# Patient Record
Sex: Female | Born: 1940 | Race: White | Hispanic: No | Smoking: Former smoker
Health system: Southern US, Community
[De-identification: ages and names within clinical notes are randomized; demographics above are authoritative.]

## PROBLEM LIST (undated history)

## (undated) DIAGNOSIS — E785 Hyperlipidemia, unspecified: Secondary | ICD-10-CM

## (undated) DIAGNOSIS — I341 Nonrheumatic mitral (valve) prolapse: Secondary | ICD-10-CM

## (undated) DIAGNOSIS — F329 Major depressive disorder, single episode, unspecified: Secondary | ICD-10-CM

## (undated) DIAGNOSIS — I10 Essential (primary) hypertension: Secondary | ICD-10-CM

## (undated) DIAGNOSIS — M797 Fibromyalgia: Secondary | ICD-10-CM

## (undated) DIAGNOSIS — M419 Scoliosis, unspecified: Secondary | ICD-10-CM

## (undated) DIAGNOSIS — F419 Anxiety disorder, unspecified: Secondary | ICD-10-CM

## (undated) DIAGNOSIS — IMO0002 Reserved for concepts with insufficient information to code with codable children: Secondary | ICD-10-CM

## (undated) DIAGNOSIS — M810 Age-related osteoporosis without current pathological fracture: Secondary | ICD-10-CM

## (undated) DIAGNOSIS — D131 Benign neoplasm of stomach: Secondary | ICD-10-CM

## (undated) DIAGNOSIS — F32A Depression, unspecified: Secondary | ICD-10-CM

## (undated) HISTORY — DX: Depression, unspecified: F32.A

## (undated) HISTORY — DX: Fibromyalgia: M79.7

## (undated) HISTORY — DX: Essential (primary) hypertension: I10

## (undated) HISTORY — DX: Scoliosis, unspecified: M41.9

## (undated) HISTORY — DX: Anxiety disorder, unspecified: F41.9

## (undated) HISTORY — DX: Nonrheumatic mitral (valve) prolapse: I34.1

## (undated) HISTORY — DX: Benign neoplasm of stomach: D13.1

## (undated) HISTORY — PX: BREAST SURGERY: SHX581

## (undated) HISTORY — PX: BACK SURGERY: SHX140

## (undated) HISTORY — DX: Reserved for concepts with insufficient information to code with codable children: IMO0002

## (undated) HISTORY — DX: Hyperlipidemia, unspecified: E78.5

## (undated) HISTORY — PX: BREAST EXCISIONAL BIOPSY: SUR124

## (undated) HISTORY — DX: Age-related osteoporosis without current pathological fracture: M81.0

## (undated) HISTORY — PX: TUBAL LIGATION: SHX77

## (undated) HISTORY — PX: TONSILLECTOMY AND ADENOIDECTOMY: SUR1326

## (undated) HISTORY — DX: Major depressive disorder, single episode, unspecified: F32.9

---

## 1999-10-22 ENCOUNTER — Other Ambulatory Visit: Admission: RE | Admit: 1999-10-22 | Discharge: 1999-10-22 | Payer: Self-pay | Admitting: Obstetrics & Gynecology

## 1999-10-22 DIAGNOSIS — D131 Benign neoplasm of stomach: Secondary | ICD-10-CM

## 1999-10-22 HISTORY — DX: Benign neoplasm of stomach: D13.1

## 2000-10-08 ENCOUNTER — Encounter (INDEPENDENT_AMBULATORY_CARE_PROVIDER_SITE_OTHER): Payer: Self-pay | Admitting: Specialist

## 2000-10-08 ENCOUNTER — Other Ambulatory Visit: Admission: RE | Admit: 2000-10-08 | Discharge: 2000-10-08 | Payer: Self-pay | Admitting: Gastroenterology

## 2000-11-11 ENCOUNTER — Other Ambulatory Visit: Admission: RE | Admit: 2000-11-11 | Discharge: 2000-11-11 | Payer: Self-pay | Admitting: Obstetrics & Gynecology

## 2001-11-20 ENCOUNTER — Other Ambulatory Visit: Admission: RE | Admit: 2001-11-20 | Discharge: 2001-11-20 | Payer: Self-pay | Admitting: Obstetrics & Gynecology

## 2003-01-25 ENCOUNTER — Other Ambulatory Visit: Admission: RE | Admit: 2003-01-25 | Discharge: 2003-01-25 | Payer: Self-pay | Admitting: Obstetrics & Gynecology

## 2004-03-22 ENCOUNTER — Other Ambulatory Visit: Admission: RE | Admit: 2004-03-22 | Discharge: 2004-03-22 | Payer: Self-pay | Admitting: Obstetrics & Gynecology

## 2005-06-20 ENCOUNTER — Ambulatory Visit: Payer: Self-pay | Admitting: Pulmonary Disease

## 2005-07-18 ENCOUNTER — Other Ambulatory Visit: Admission: RE | Admit: 2005-07-18 | Discharge: 2005-07-18 | Payer: Self-pay | Admitting: Obstetrics & Gynecology

## 2005-11-07 ENCOUNTER — Ambulatory Visit: Payer: Self-pay | Admitting: Pulmonary Disease

## 2005-11-18 ENCOUNTER — Ambulatory Visit: Payer: Self-pay | Admitting: Pulmonary Disease

## 2006-06-13 ENCOUNTER — Ambulatory Visit: Payer: Self-pay | Admitting: Pulmonary Disease

## 2006-08-05 ENCOUNTER — Ambulatory Visit: Payer: Self-pay | Admitting: Pulmonary Disease

## 2006-08-11 ENCOUNTER — Ambulatory Visit: Payer: Self-pay | Admitting: Pulmonary Disease

## 2006-08-11 LAB — CONVERTED CEMR LAB
Albumin: 3.9 g/dL (ref 3.5–5.2)
BUN: 14 mg/dL (ref 6–23)
CO2: 29 meq/L (ref 19–32)
Chloride: 108 meq/L (ref 96–112)
Chol/HDL Ratio, serum: 2.2
Creatinine, Ser: 1 mg/dL (ref 0.4–1.2)
GFR calc non Af Amer: 60 mL/min
Glomerular Filtration Rate, Af Am: 72 mL/min/{1.73_m2}
HDL: 48 mg/dL (ref >39.0)
Total Bilirubin: 0.5 mg/dL (ref 0.3–1.2)
Triglyceride fasting, serum: 42 mg/dL (ref 0–149)

## 2007-02-03 ENCOUNTER — Ambulatory Visit: Payer: Self-pay | Admitting: Pulmonary Disease

## 2007-02-03 LAB — CONVERTED CEMR LAB
ALT: 15 units/L (ref 0–40)
AST: 21 units/L (ref 0–37)
Albumin: 3.9 g/dL (ref 3.5–5.2)
Basophils Absolute: 0 10*3/uL (ref 0.0–0.1)
Bilirubin, Direct: 0.1 mg/dL (ref 0.0–0.3)
Calcium: 9.5 mg/dL (ref 8.4–10.5)
Chloride: 105 meq/L (ref 96–112)
Cholesterol: 145 mg/dL (ref 0–200)
Eosinophils Absolute: 0.1 10*3/uL (ref 0.0–0.6)
GFR calc Af Amer: 93 mL/min
GFR calc non Af Amer: 77 mL/min
Glucose, Bld: 93 mg/dL (ref 70–99)
HDL: 59.7 mg/dL (ref >39.0)
Lymphocytes Relative: 38.6 % (ref 12.0–46.0)
MCHC: 34.9 g/dL (ref 30.0–36.0)
MCV: 89 fL (ref 78.0–100.0)
Monocytes Relative: 7 % (ref 3.0–11.0)
Neutro Abs: 2.6 10*3/uL (ref 1.4–7.7)
Platelets: 252 10*3/uL (ref 150–400)
RBC: 4.54 M/uL (ref 3.87–5.11)
TSH: 1.26 microintl units/mL (ref 0.35–5.50)
Total CHOL/HDL Ratio: 2.4
Triglycerides: 87 mg/dL (ref 0–149)

## 2007-08-02 ENCOUNTER — Emergency Department (HOSPITAL_COMMUNITY): Admission: EM | Admit: 2007-08-02 | Discharge: 2007-08-02 | Payer: Self-pay | Admitting: Emergency Medicine

## 2007-08-04 ENCOUNTER — Emergency Department (HOSPITAL_COMMUNITY): Admission: EM | Admit: 2007-08-04 | Discharge: 2007-08-04 | Payer: Self-pay | Admitting: Emergency Medicine

## 2007-08-04 ENCOUNTER — Ambulatory Visit (HOSPITAL_COMMUNITY): Admission: RE | Admit: 2007-08-04 | Discharge: 2007-08-04 | Payer: Self-pay | Admitting: Emergency Medicine

## 2007-08-11 ENCOUNTER — Ambulatory Visit: Payer: Self-pay | Admitting: Pulmonary Disease

## 2007-12-15 ENCOUNTER — Ambulatory Visit: Payer: Self-pay | Admitting: Pulmonary Disease

## 2007-12-15 ENCOUNTER — Telehealth: Payer: Self-pay | Admitting: Pulmonary Disease

## 2007-12-15 LAB — CONVERTED CEMR LAB
Calcium: 10 mg/dL (ref 8.4–10.5)
Chloride: 101 meq/L (ref 96–112)
Creatinine, Ser: 0.9 mg/dL (ref 0.4–1.2)
GFR calc non Af Amer: 67 mL/min
Glucose, Bld: 101 mg/dL — ABNORMAL HIGH (ref 70–99)
Sodium: 141 meq/L (ref 135–145)

## 2008-01-13 ENCOUNTER — Ambulatory Visit: Payer: Self-pay | Admitting: Gastroenterology

## 2008-01-13 ENCOUNTER — Telehealth: Payer: Self-pay | Admitting: Pulmonary Disease

## 2008-01-21 ENCOUNTER — Telehealth (INDEPENDENT_AMBULATORY_CARE_PROVIDER_SITE_OTHER): Payer: Self-pay | Admitting: *Deleted

## 2008-01-22 ENCOUNTER — Encounter: Payer: Self-pay | Admitting: Pulmonary Disease

## 2008-01-22 ENCOUNTER — Ambulatory Visit: Payer: Self-pay | Admitting: Gastroenterology

## 2008-04-18 DIAGNOSIS — M810 Age-related osteoporosis without current pathological fracture: Secondary | ICD-10-CM

## 2008-04-18 DIAGNOSIS — E7800 Pure hypercholesterolemia, unspecified: Secondary | ICD-10-CM

## 2008-04-18 DIAGNOSIS — M545 Low back pain, unspecified: Secondary | ICD-10-CM | POA: Insufficient documentation

## 2008-04-18 DIAGNOSIS — Z8679 Personal history of other diseases of the circulatory system: Secondary | ICD-10-CM | POA: Insufficient documentation

## 2008-04-18 DIAGNOSIS — IMO0001 Reserved for inherently not codable concepts without codable children: Secondary | ICD-10-CM

## 2008-04-18 DIAGNOSIS — K5900 Constipation, unspecified: Secondary | ICD-10-CM | POA: Insufficient documentation

## 2008-04-18 DIAGNOSIS — I10 Essential (primary) hypertension: Secondary | ICD-10-CM

## 2008-04-18 DIAGNOSIS — F411 Generalized anxiety disorder: Secondary | ICD-10-CM | POA: Insufficient documentation

## 2008-04-18 HISTORY — DX: Generalized anxiety disorder: F41.1

## 2008-04-18 HISTORY — DX: Constipation, unspecified: K59.00

## 2008-04-18 HISTORY — DX: Essential (primary) hypertension: I10

## 2008-04-18 HISTORY — DX: Pure hypercholesterolemia, unspecified: E78.00

## 2008-04-18 HISTORY — DX: Age-related osteoporosis without current pathological fracture: M81.0

## 2008-04-18 HISTORY — DX: Personal history of other diseases of the circulatory system: Z86.79

## 2008-04-18 HISTORY — DX: Reserved for inherently not codable concepts without codable children: IMO0001

## 2008-04-18 HISTORY — DX: Low back pain, unspecified: M54.50

## 2008-05-12 ENCOUNTER — Encounter: Payer: Self-pay | Admitting: Pulmonary Disease

## 2008-06-14 ENCOUNTER — Ambulatory Visit: Payer: Self-pay | Admitting: Pulmonary Disease

## 2008-06-14 DIAGNOSIS — R002 Palpitations: Secondary | ICD-10-CM

## 2008-06-14 HISTORY — DX: Palpitations: R00.2

## 2008-06-19 DIAGNOSIS — D131 Benign neoplasm of stomach: Secondary | ICD-10-CM | POA: Insufficient documentation

## 2008-06-19 HISTORY — DX: Benign neoplasm of stomach: D13.1

## 2008-06-19 LAB — CONVERTED CEMR LAB
Albumin: 4.2 g/dL (ref 3.5–5.2)
Alkaline Phosphatase: 61 units/L (ref 39–117)
BUN: 14 mg/dL (ref 6–23)
Bacteria, UA: NEGATIVE
Basophils Absolute: 0 10*3/uL (ref 0.0–0.1)
Cholesterol: 176 mg/dL (ref 0–200)
Crystals: NEGATIVE
Eosinophils Absolute: 0.1 10*3/uL (ref 0.0–0.7)
GFR calc Af Amer: 93 mL/min
GFR calc non Af Amer: 77 mL/min
HDL: 68 mg/dL (ref >39.0)
LDL Cholesterol: 100 mg/dL — ABNORMAL HIGH (ref 0–99)
Leukocytes, UA: NEGATIVE
Lymphocytes Relative: 34.4 % (ref 12.0–46.0)
MCHC: 34.7 g/dL (ref 30.0–36.0)
MCV: 92.3 fL (ref 78.0–100.0)
Neutrophils Relative %: 55.4 % (ref 43.0–77.0)
Nitrite: NEGATIVE
Platelets: 234 10*3/uL (ref 150–400)
Potassium: 4.1 meq/L (ref 3.5–5.1)
RBC / HPF: NONE SEEN
Specific Gravity, Urine: <=1.005 (ref 1.000–1.03)
TSH: 0.92 microintl units/mL (ref 0.35–5.50)
Total Bilirubin: 0.8 mg/dL (ref 0.3–1.2)
Urobilinogen, UA: 0.2 (ref 0.0–1.0)
VLDL: 8 mg/dL (ref 0–40)
WBC, UA: NONE SEEN cells/hpf
pH: 7 (ref 5.0–8.0)

## 2008-07-28 ENCOUNTER — Telehealth (INDEPENDENT_AMBULATORY_CARE_PROVIDER_SITE_OTHER): Payer: Self-pay | Admitting: *Deleted

## 2008-08-12 ENCOUNTER — Ambulatory Visit: Payer: Self-pay | Admitting: Licensed Clinical Social Worker

## 2008-08-12 ENCOUNTER — Telehealth (INDEPENDENT_AMBULATORY_CARE_PROVIDER_SITE_OTHER): Payer: Self-pay | Admitting: *Deleted

## 2008-08-19 ENCOUNTER — Ambulatory Visit: Payer: Self-pay | Admitting: Licensed Clinical Social Worker

## 2008-08-25 ENCOUNTER — Telehealth: Payer: Self-pay | Admitting: Pulmonary Disease

## 2008-10-10 ENCOUNTER — Telehealth (INDEPENDENT_AMBULATORY_CARE_PROVIDER_SITE_OTHER): Payer: Self-pay | Admitting: *Deleted

## 2009-02-10 ENCOUNTER — Telehealth: Payer: Self-pay | Admitting: Pulmonary Disease

## 2009-03-10 ENCOUNTER — Telehealth (INDEPENDENT_AMBULATORY_CARE_PROVIDER_SITE_OTHER): Payer: Self-pay | Admitting: *Deleted

## 2009-03-31 ENCOUNTER — Telehealth: Payer: Self-pay | Admitting: Pulmonary Disease

## 2009-07-25 ENCOUNTER — Telehealth (INDEPENDENT_AMBULATORY_CARE_PROVIDER_SITE_OTHER): Payer: Self-pay | Admitting: *Deleted

## 2009-10-30 ENCOUNTER — Ambulatory Visit: Payer: Self-pay | Admitting: Pulmonary Disease

## 2009-10-30 DIAGNOSIS — J984 Other disorders of lung: Secondary | ICD-10-CM | POA: Insufficient documentation

## 2009-11-15 ENCOUNTER — Ambulatory Visit: Payer: Self-pay | Admitting: Pulmonary Disease

## 2009-11-26 LAB — CONVERTED CEMR LAB
ALT: 21 units/L (ref 0–35)
BUN: 15 mg/dL (ref 6–23)
CO2: 30 meq/L (ref 19–32)
Chloride: 105 meq/L (ref 96–112)
Cholesterol: 150 mg/dL (ref 0–200)
Creatinine, Ser: 0.8 mg/dL (ref 0.4–1.2)
Eosinophils Relative: 1.3 % (ref 0.0–5.0)
HDL: 73.6 mg/dL (ref >39.00)
LDL Cholesterol: 68 mg/dL (ref 0–99)
Lymphocytes Relative: 37.2 % (ref 12.0–46.0)
Monocytes Relative: 8.7 % (ref 3.0–12.0)
Neutrophils Relative %: 52.2 % (ref 43.0–77.0)
Platelets: 204 10*3/uL (ref 150.0–400.0)
TSH: 0.72 microintl units/mL (ref 0.35–5.50)
Total Protein: 7.2 g/dL (ref 6.0–8.3)
Triglycerides: 41 mg/dL (ref 0.0–149.0)
WBC: 3.9 10*3/uL — ABNORMAL LOW (ref 4.5–10.5)

## 2010-04-11 ENCOUNTER — Telehealth (INDEPENDENT_AMBULATORY_CARE_PROVIDER_SITE_OTHER): Payer: Self-pay | Admitting: *Deleted

## 2010-06-06 ENCOUNTER — Telehealth (INDEPENDENT_AMBULATORY_CARE_PROVIDER_SITE_OTHER): Payer: Self-pay | Admitting: *Deleted

## 2010-06-28 ENCOUNTER — Encounter: Admission: RE | Admit: 2010-06-28 | Discharge: 2010-06-28 | Payer: Self-pay | Admitting: Obstetrics & Gynecology

## 2010-07-24 ENCOUNTER — Telehealth: Payer: Self-pay | Admitting: Pulmonary Disease

## 2010-07-30 ENCOUNTER — Encounter: Admission: RE | Admit: 2010-07-30 | Discharge: 2010-07-30 | Payer: Self-pay | Admitting: Surgery

## 2010-07-30 ENCOUNTER — Ambulatory Visit (HOSPITAL_BASED_OUTPATIENT_CLINIC_OR_DEPARTMENT_OTHER): Admission: RE | Admit: 2010-07-30 | Discharge: 2010-07-30 | Payer: Self-pay | Admitting: Surgery

## 2010-11-01 ENCOUNTER — Ambulatory Visit
Admission: RE | Admit: 2010-11-01 | Discharge: 2010-11-01 | Payer: Self-pay | Source: Home / Self Care | Attending: Pulmonary Disease | Admitting: Pulmonary Disease

## 2010-11-11 ENCOUNTER — Encounter: Payer: Self-pay | Admitting: Obstetrics & Gynecology

## 2010-11-14 ENCOUNTER — Ambulatory Visit
Admission: RE | Admit: 2010-11-14 | Discharge: 2010-11-14 | Payer: Self-pay | Source: Home / Self Care | Attending: Pulmonary Disease | Admitting: Pulmonary Disease

## 2010-11-14 ENCOUNTER — Other Ambulatory Visit: Payer: Self-pay | Admitting: Pulmonary Disease

## 2010-11-14 LAB — BASIC METABOLIC PANEL
Chloride: 105 mEq/L (ref 96–112)
Potassium: 4.4 mEq/L (ref 3.5–5.1)

## 2010-11-14 LAB — CBC WITH DIFFERENTIAL/PLATELET
Basophils Absolute: 0 10*3/uL (ref 0.0–0.1)
Eosinophils Absolute: 0.1 10*3/uL (ref 0.0–0.7)
Lymphocytes Relative: 38.8 % (ref 12.0–46.0)
MCHC: 35.4 g/dL (ref 30.0–36.0)
Neutrophils Relative %: 50.5 % (ref 43.0–77.0)
Platelets: 184 10*3/uL (ref 150.0–400.0)
RDW: 12.1 % (ref 11.5–14.6)

## 2010-11-14 LAB — URINALYSIS
Bilirubin Urine: NEGATIVE
Ketones, ur: NEGATIVE
Nitrite: NEGATIVE
Specific Gravity, Urine: 1.01 (ref 1.000–1.030)
Total Protein, Urine: NEGATIVE
pH: 6.5 (ref 5.0–8.0)

## 2010-11-14 LAB — LIPID PANEL
Cholesterol: 167 mg/dL (ref 0–200)
LDL Cholesterol: 83 mg/dL (ref 0–99)
Triglycerides: 40 mg/dL (ref 0.0–149.0)
VLDL: 8 mg/dL (ref 0.0–40.0)

## 2010-11-14 LAB — HEPATIC FUNCTION PANEL
ALT: 22 U/L (ref 0–35)
AST: 28 U/L (ref 0–37)
Bilirubin, Direct: 0.1 mg/dL (ref 0.0–0.3)
Total Bilirubin: 0.7 mg/dL (ref 0.3–1.2)

## 2010-11-20 NOTE — Progress Notes (Signed)
  Phone Note Other Incoming   Request: Send information Summary of Call: Request for records received from [facility]. Request forwarded to [facility].

## 2010-11-20 NOTE — Progress Notes (Signed)
  Phone Note Other Incoming   Request: Send information Summary of Call: Request for records received from [facility]. Request forwarded to [facility].     

## 2010-11-20 NOTE — Progress Notes (Signed)
Summary: Rx for zpak sent to pts pharmacy  Phone Note Call from Patient Call back at Home Phone [phone]   Caller: Patient Call For: NADEL Summary of Call: pt feels like she has a cold.  cough "may have fever" but hasn't checked. coughing since yesterday. has taken zinc tablets. pt has surgery scheduled for monday. [facility] in [place] on [address].  Initial call taken by: Tivis Ringer, CNA,  July 24, 2010 4:17 PM  Follow-up for Phone Call        called and spoke with pt.  Pt states she believes she has a "cold."  Sx started yesterday.  Pt states she is scheduled for outpatient surgery to remove calcifications on breast on Monday 07/30/2010 and was told by the nurse to call our office for abx.  Pt c/o head congestion with clear nasal drainage, non-productive cough, body aches, eyes red, headache and horaseness.  Pt denied sore throat, sweats or chills.  Please advise.  Thanks. Aundra Millet Reynolds LPN  July 24, 2010 4:45 PM  NKDA   Additional Follow-up for Phone Call Additional follow up Details #1::        zpak sent to pts pharmacy, mucinex otc 2 by mouth two times a day per Dr Kriste Basque, pt aware Armita Donzetta Matters RCP, LPN  July 24, 2010 4:58 PM     New/Updated Medications: ZITHROMAX Z-PAK 250 MG TABS (AZITHROMYCIN) Take as directed Prescriptions: ZITHROMAX Z-PAK 250 MG TABS (AZITHROMYCIN) Take as directed  #1 x 0   Entered by:   Renold Genta RCP, LPN   Authorized by:   Michele Mcalpine MD   Signed by:   Renold Genta RCP, LPN on 42/35/3614   Method used:   Electronically to        [facility] (retail)       [address]       [place], [place]  [address]       Ph: [phone]       Fax: [phone]   RxID:   [id number]

## 2010-11-20 NOTE — Assessment & Plan Note (Signed)
Summary: rov/meds/apc   CC:  17 month ROV & review of mult medical problems....  History of Present Illness: 70 y/o WF here for a follow up visit... she has multiple medical problems as noted below...    ~  Aug09:  she states that she has been doing OK- except for anxiety (her father passed away in October 26, 2023 and she is handling his estate problems)...   ~  October 30, 2009:  she has still been under a good bit of stress but is coping satis w/ the Alprazolam Rx... BP controlled on Metoprolol;  due for f/u FLP on the Simva20;  needs f/u CXR & refills today...    Current Problem List:  PHYSICAL EXAMINATION (ICD-V70.0) - GYN= DrNeal for PAP, Mammograms, BMD (last seen 3/09)...   ~  Immunization:  she had Pneumovax 2000, & Tetanus 2000...  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  ~  CXR 1/11 showed   HYPERTENSION, BORDERLINE (ICD-401.9) - on METOPROLOL ER 50mg /d... BP= 112/62 today and similar at home... denies HA, fatigue, visual changes, CP, palipit, dizziness, syncope, dyspnea, edema, etc...  MITRAL VALVE PROLAPSE, HX OF (ICD-V12.50) & PALPITATIONS (ICD-785.1) - on ASA 81mg /d... she denies recent problems w/ CP or palpit...  ~  ? faint right carotid bruit in past... CDopplers 4/04 were WNL- no plaque etc...  HYPERCHOLESTEROLEMIA (ICD-272.0) - on SIMVASTATIN 20mg /d...  ~  FLP 4/08 showed TChol 145, TG 87. HDL 60, LDL 68... continue same med...  ~  FLP 5/09 showed TChol 176, TG 41, HDL 68, LDL 100  ~  FLP 1/11 showed =   Hx of GASTRIC POLYP (ICD-211.1)  ~  last EGD 12/01 showed tiny gastric polyps, bx= benign gastric gland polyp.  CONSTIPATION (ICD-564.00) - she uses "colon cleanser" every 2 weeks...  ~  last colonoscopy 4/09 was normal x for a tortuous colon- no divertics, no polyps, etc... f/u 8yrs.  BACK PAIN, LUMBAR (ICD-724.2) - Hx DDD & eval by DrRoy(Neurosurg) and DrTooke (Ortho) w/ 2 shots  that helped... on-going Rx from Chiropractor monthly which "helps my back & my constipation"...  FIBROMYALGIA (ICD-729.1)  OSTEOPOROSIS (ICD-733.00) - on ACTONEL 35mg /wk + calcium, Vits, Vit D... BMDs and meds from DrNeal.  ANXIETY (ICD-300.00) - takes ALPRAZOLAM 0.5mg  as needed... tried Lexapro & stopped this on her own.    Allergies (verified): No Known Drug Allergies  Past History:  Past Medical History:  OTHER DISEASES OF LUNG NOT ELSEWHERE CLASSIFIED (ICD-518.89) HYPERTENSION, BORDERLINE (ICD-401.9) MITRAL VALVE PROLAPSE, HX OF (ICD-V12.50) PALPITATIONS (ICD-785.1) HYPERCHOLESTEROLEMIA (ICD-272.0) Hx of GASTRIC POLYP (ICD-211.1) CONSTIPATION (ICD-564.00) BACK PAIN, LUMBAR (ICD-724.2) FIBROMYALGIA (ICD-729.1) OSTEOPOROSIS (ICD-733.00) ANXIETY (ICD-300.00)  Past Surgical History: S/P T & A S/P BTL  Family History: Reviewed history from 06/14/2008 and no changes required. mother deceased age 65 from aspiration father deceased age 68 from head injury (fall)  hx of HBP 1 sibling alive age 28  hx of pancreatic cancer  Social History: Reviewed history from 06/14/2008 and no changes required. 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    Vital Signs:  Patient profile:   70 year old female Height:      62 inches Weight:      102.13 pounds BMI:     18.75 O2 Sat:      98 % on Room air Temp:     96.6 degrees F  oral Pulse rate:   53 / minute BP sitting:   112 / 62  (left arm) Cuff size:   regular  Vitals Entered By: Randell Loop CMA (October 30, 2009 2:06 PM)  O2 Sat at Rest %:  98 O2 Flow:  Room air CC: 17 month ROV & review of mult medical problems... Is Patient Diabetic? No Pain Assessment Patient in pain? no      Comments meds updated today   Physical Exam  Additional Exam:  WD, WN, 70 y/o WF in NAD... GENERAL:  Alert & oriented; pleasant & cooperative... HEENT:  [place]/AT, EOM-wnl, PERRLA, Fundi-benign, EACs-clear, TMs-wnl, NOSE-clear, THROAT-clear & wnl. NECK:  Supple w/ full ROM; no JVD; normal carotid impulses w/o bruits; no thyromegaly or nodules palpated; no lymphadenopathy. CHEST:  Clear to P & A; without wheezes/ rales/ or rhonchi heard... HEART:  Regular Rhythm; without murmurs/ rubs/ or gallops detected... ABDOMEN:  Soft & nontender; normal bowel sounds; no organomegaly or masses palpated... EXT: without deformities or arthritic changes; no varicose veins/ venous insuffic/ or edema. NEURO:  CN's intact; motor testing normal; sensory testing normal; gait normal & balance OK. DERM:  No lesions noted; no rash etc...     CXR  Procedure date:  10/30/2009  Findings:      CHEST - 2 VIEW   Comparison: Chest x-ray of 05/25/2008   Findings: Apical pleuroparenchymal scarring right greater than left appears stable.  The lungs are clear and slightly hyperaerated.  No adenopathy is seen.  The heart is within upper limits of normal. No bony abnormality is noted.   IMPRESSION: Stable apical scarring right greater than left.  Stable hyperaeration.  No active process.   Read By:  Juline Patch,  M.D.       MISC. 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 Orders: T-2 View CXR (71020TC)  Problem # 2:  HYPERTENSION, BORDERLINE (ICD-401.9) Controlled on meds... continue same. Her updated medication list for this problem includes:    Metoprolol Succinate 50 Mg Tb24 (Metoprolol succinate) .Marland Kitchen... Take 1 tablet by mouth once a day  Problem # 3:  MITRAL VALVE PROLAPSE, HX OF (ICD-V12.50) She is asymptomatic...  Problem # 4:  HYPERCHOLESTEROLEMIA (ICD-272.0) Controlled by Simva20... continue same, due for f/u FLP & she will ret soon. Her updated medication list for this problem includes:    Simvastatin 20 Mg Tabs (Simvastatin) .Marland Kitchen... Take 1 tablet by mouth once a day  Problem # 5:  CONSTIPATION (ICD-564.00) GI is stable...   Problem # 6:  BACK PAIN, LUMBAR (ICD-724.2) She still sees chiropractor monthly for adjustments... Her updated medication list for this problem includes:    Aspirin Adult Low Strength 81 Mg Tbec (Aspirin) .Marland Kitchen... Take 1 tablet by mouth once a day  Problem # 7:  OSTEOPOROSIS (ICD-733.00) Followed by drNeal and repeat BMD due soon by her hx... Her updated medication list  for this problem includes:    Actonel 35 Mg Tabs (Risedronate sodium) .Marland Kitchen... Take one tablet by mouth every week  Problem # 8:  ANXIETY (ICD-300.00) Refill the Alprazolam... The following medications were removed from the medication list:    Lexapro 10 Mg Tabs (Escitalopram oxalate) .Marland Kitchen... Take 1po once daily Her updated medication list for this problem includes:    Alprazolam 0.5 Mg Tabs (Alprazolam) .Marland Kitchen... 1/2 to 1 tab by mouth three times a day as needed for nerves...  Complete Medication List: 1)  Aspirin Adult Low Strength 81 Mg Tbec (Aspirin) .... Take 1 tablet by mouth once a day 2)  Metoprolol Succinate 50 Mg Tb24 (Metoprolol succinate) .... Take 1 tablet by mouth once a day 3)  Simvastatin 20 Mg Tabs (Simvastatin) .... Take 1 tablet by mouth once a day 4)  Fish Oil 1000 Mg Caps (Omega-3 fatty acids)  .... Take 1 tablet by mouth once a day 5)  Actonel 35 Mg Tabs (Risedronate sodium) .... Take one tablet by mouth every week 6)  Calcium 600/vitamin D 600-400 Mg-unit Chew (Calcium carbonate-vitamin d) .... Take 1 tablet by mouth once a day 7)  Alprazolam 0.5 Mg Tabs (Alprazolam) .... 1/2 to 1 tab by mouth three times a day as needed for nerves... 8)  B Complex Tabs (B complex vitamins) .... Take 1 tablet by mouth once a day 9)  Mega Coq10 30 Mg Caps (Coenzyme q10) .... Take 1 tablet by mouth once a day  Other Orders: Prescription Created Electronically [phone])  Patient Instructions: 1)  Today we updated your med list- see below.... 2)  We refilled your meds per request... 3)  Today we did your follow up CXR.Marland KitchenMarland Kitchen 4)  Please return for FASTING blood work at your convenience one morning this week.... then please call the "phone tree" in a few days for your lab results.Marland KitchenMarland Kitchen  5)  Don't forget to take the Ensure/ Sustacal/ Boost daily to help your weight... 6)  Call for any problems.Marland KitchenMarland Kitchen 7)  Please schedule a follow-up appointment in 1 year, sooner as needed... Prescriptions: ALPRAZOLAM 0.5 MG TABS (ALPRAZOLAM) 1/2 to 1 tab by mouth three times a day as needed for nerves...  #100 x 5   Entered and Authorized by:   Michele Mcalpine MD   Signed by:   Michele Mcalpine MD on 10/30/2009   Method used:   Print then Give to Patient   RxID:   [id number] SIMVASTATIN 20 MG  TABS (SIMVASTATIN) Take 1 tablet by mouth once a day  #30 x prn   Entered and Authorized by:   Michele Mcalpine MD   Signed by:   Michele Mcalpine MD on 10/30/2009   Method used:   Print then Give to Patient   RxID:   [id number] METOPROLOL SUCCINATE 50 MG  TB24 (METOPROLOL SUCCINATE) Take 1 tablet by mouth once a day  #30 x prn   Entered and Authorized by:   Michele Mcalpine MD   Signed by:   Michele Mcalpine MD on 10/30/2009   Method used:   Print then Give to Patient   RxID:   [id number]

## 2010-11-22 NOTE — Assessment & Plan Note (Signed)
Summary: physicial//sh   CC:  Yearly ROV & CPX....  History of Present Illness: 70 y/o WF here for a follow up visit & CPX...   ~  Aug09:  she states that she has been doing OK- except for anxiety (her father passed away in 26-Oct-2023 and she is handling his estate problems)...   ~  October 30, 2009:  she has still been under a good bit of stress but is coping satis w/ the Alprazolam Rx... BP controlled on Metoprolol;  due for f/u FLP on the Simva20;  needs f/u CXR & refills today...   ~  November 01, 2010:  she has had some persistant LBP w/ radiation to her right hip area> she had had prev evals from Neurosurg- DrRoy, and Ortho- DrTooke;  she still sees a Land, DrWilliams every month w/ some relief... I advised her to f/u w/ DrTooke if the chiro manipulations are not providing benefit as she may need f/u MRI etc... medically she is stable> notes continued stress & she takes the Alpraz Bid which helps;  BP controlled on MetopER50;  occas palpit w/o progression & she knows to elim caffeine;  Chol controlled w/ diet + Simva20;  she reports that DrNeal has stopped her Actonel for a drug holiday...    Current Problem List:  PHYSICAL EXAMINATION (ICD-V70.0)  ~  GI:  followed by DrStark w/ colon 4/09= neg... f/u planned 38yrs.  ~  GYN:  DrNeal for PAP, Mammograms, BMD & he stopped her Bisphos Rx in 2011 for a drug holiday...   ~  Immunization:  she had Pneumovax 2000 & Tetanus 2000... get yearly Flu vaccine in autumn.  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  ~  CXR 1/11 showed chr changes, NAD.Marland Kitchen.  ~  CXR 1/12 showed similar chr changes, NAD...  HYPERTENSION, BORDERLINE (ICD-401.9) - on METOPROLOL ER 50mg /d... BP= 110/60 today and similar at home... denies HA, fatigue, visual changes, CP, palipit, dizziness, syncope, dyspnea, edema, etc...  MITRAL VALVE PROLAPSE, HX OF (ICD-V12.50) & PALPITATIONS  (ICD-785.1) - on ASA 81mg /d... she denies recent problems w/ CP or palpit...  ~  ? faint right carotid bruit in past... CDopplers 4/04 were WNL- no plaque etc...  HYPERCHOLESTEROLEMIA (ICD-272.0) - on SIMVASTATIN 20mg /d...  ~  FLP 4/08 showed TChol 145, TG 87. HDL 60, LDL 68... continue same med...  ~  FLP 5/09 showed TChol 176, TG 41, HDL 68, LDL 100  ~  FLP 1/11 showed TChol 150, TG 41, HDL 74, LDL 68  Hx of GASTRIC POLYP (ICD-211.1)  ~  last EGD 12/01 showed tiny gastric polyps, bx= benign gastric gland polyp.  CONSTIPATION (ICD-564.00) - she uses "colon cleanser" every 2 weeks...  ~  last colonoscopy 4/09 was normal x for a tortuous colon- no divertics, no polyps, etc... f/u 30yrs.  BACK PAIN, LUMBAR (ICD-724.2) - Hx DDD & eval by DrRoy(Neurosurg) and DrTooke (Ortho) w/ 2 shots that helped... on-going Rx from Chiropractor monthly which "helps my back & my constipation"...  ~  1/12:  she notes incr back & right hip discomfort, still getting chiropractic rx Qmonth- advised f/u w/ Ortho for XRays/ Scans if not resolved.  FIBROMYALGIA (ICD-729.1)  OSTEOPOROSIS (ICD-733.00) - prev on Actonel per DrNeal (stopped in 2011 for drug holiday) + calcium, Vits, Vit D... BMDs per DrNeal.  ANXIETY (ICD-300.00) - takes ALPRAZOLAM 0.5mg  as needed (taking 2 daily)... tried Lexapro & stopped this on her own.  Preventive Screening-Counseling & Management  Alcohol-Tobacco     Smoking Status: quit     Year Quit: 1980's  Allergies (verified): No Known Drug Allergies  Comments:  Nurse/Medical Assistant: The patient's medications and allergies were reviewed with the patient and were updated in the Medication and Allergy Lists.  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 mother deceased age 64 from aspiration father deceased age 60 from head injury (fall)  hx of HBP 1 sibling alive age 22  hx of pancreatic cancer  Social History: Reviewed history from 10/30/2009 and no changes required. 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  The patient denies fever, chills, sweats, anorexia, fatigue, weakness, malaise, weight loss, sleep disorder, blurring, diplopia, eye irritation, eye discharge, vision loss, eye pain, photophobia, earache, ear discharge, tinnitus, decreased hearing, nasal congestion, nosebleeds, sore throat, hoarseness, chest pain, palpitations, syncope, dyspnea on exertion, orthopnea, PND, peripheral edema, cough, dyspnea at rest, excessive sputum, hemoptysis, wheezing, pleurisy, nausea, vomiting, diarrhea, constipation, change in bowel habits, abdominal pain, melena, hematochezia, jaundice, gas/bloating, indigestion/heartburn, dysphagia, odynophagia, dysuria, hematuria, urinary frequency, urinary hesitancy, nocturia, incontinence, joint swelling, muscle cramps, muscle weakness, stiffness, sciatica, restless legs, leg pain at night, leg pain with exertion, rash, itching, dryness, suspicious lesions, paralysis, paresthesias, seizures, tremors, vertigo, transient blindness, frequent falls, frequent headaches, difficulty walking, depression, memory loss, confusion, cold intolerance, heat intolerance, polydipsia, polyphagia, polyuria, unusual weight change, abnormal bruising, bleeding, enlarged lymph nodes, urticaria, allergic rash, hay fever, and  recurrent infections.    Vital Signs:  Patient profile:   71 year old female Height:      62 inches Weight:      107 pounds BMI:     19.64 O2 Sat:      97 % on Room air Temp:     97.0 degrees F oral Pulse rate:   68 / minute BP sitting:   110 / 60  (left arm) Cuff size:   regular  Vitals Entered By: Randell Loop CMA (November 01, 2010 11:05 AM)  O2 Sat at Rest %:  97 O2 Flow:  Room air CC: Yearly ROV & CPX... Is Patient Diabetic? No Pain Assessment Patient in pain? yes      Onset of pain  lower back pain radiates into right hip Comments meds updated today with pt   Physical Exam  Additional Exam:  WD, WN, 70 y/o WF in NAD... GENERAL:  Alert & oriented; pleasant & cooperative... HEENT:  [place]/AT, EOM-wnl, PERRLA, Fundi-benign, EACs-clear, TMs-wnl, NOSE-clear, THROAT-clear & wnl. NECK:  Supple w/ full ROM; no JVD; normal carotid impulses w/o bruits; no thyromegaly or nodules palpated; no lymphadenopathy. CHEST:  Clear to P & A; without wheezes/ rales/ or rhonchi heard... HEART:  Regular Rhythm; without murmurs/ rubs/ or gallops detected... ABDOMEN:  Soft & nontender; normal bowel sounds; no organomegaly or masses palpated... EXT: without deformities or arthritic changes; no varicose veins/ venous insuffic/ or edema. NEURO:  CN's intact; motor testing normal; sensory testing normal; gait normal & balance OK.  DERM:  No lesions noted; no rash etc...    CXR  Procedure date:  11/01/2010  Findings:      CHEST - 2 VIEW Comparison: Chest 10/30/2009.   Findings: The lungs are clear.  Heart size is normal.  No pneumothorax or pleural effusion.  There is some biapical pleural scarring.   IMPRESSION: No acute finding.  Stable compared to prior exam.   Read By:  Charyl Dancer,  M.D.   MISC. Report  Procedure date:  11/02/2010  Findings:      PT TO RETURN FOR FASTING BLOOD WORK & WE WILL COMMUNICATE THE RESULTS TO HER WHEN AVAILABLE>>>   Impression &  Recommendations:  Problem # 1:  PHYSICAL EXAMINATION (ICD-V70.0) She had records sent to [facility] in July & Sept for local care, but requested physical & med refills from [place] still... Orders: T-2 View CXR (71020TC) She will return for FASTINg blood work...  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 Orders: T-2 View CXR (71020TC)  Problem # 3:  HYPERTENSION, BORDERLINE (ICD-401.9) Controlled on med>  continue same. Her updated medication list for this problem includes:    Metoprolol Succinate 50 Mg Tb24 (Metoprolol succinate) .Marland Kitchen... Take 1 tablet by mouth once a day  Problem # 4:  PALPITATIONS (ICD-785.1) Min intermittent self-limited palpit & she is advised to elim caffeine etc... continue med. Her updated medication list for this problem includes:    Metoprolol Succinate 50 Mg Tb24 (Metoprolol succinate) .Marland Kitchen... Take 1 tablet by mouth once a day  Problem # 5:  HYPERCHOLESTEROLEMIA (ICD-272.0) Stable on Simva20>  continue same for now & she will ret for FLP soon. Her updated medication list for this problem includes:    Simvastatin 20 Mg Tabs (Simvastatin) .Marland Kitchen... Take 1 tablet by mouth once a day  Problem # 6:  CONSTIPATION (ICD-564.00) GI stable & up to date...  Problem # 7:  BACK PAIN, LUMBAR (ICD-724.2) She will f/u w/ Ortho & call us to refer if she can't get her own appt... Her updated medication list for this problem includes:    Aspirin Adult Low Strength 81 Mg Tbec (Aspirin) .Marland Kitchen... Take 1 tablet by mouth once a day  Problem # 8:  ANXIETY (ICD-300.00) Requests refill Alprazolam... Her updated medication list for this problem includes:    Alprazolam 0.5 Mg Tabs (Alprazolam) .Marland Kitchen... 1/2 to 1 tab by mouth three times a day as needed for nerves...  Complete Medication List: 1)  Aspirin Adult Low Strength 81 Mg Tbec (Aspirin) .... Take 1 tablet by  mouth once a day 2)  Metoprolol Succinate 50 Mg Tb24 (Metoprolol succinate) .... Take 1 tablet by mouth once a day 3)  Simvastatin 20 Mg Tabs (Simvastatin) .... Take 1 tablet by mouth once a day 4)  Fish Oil 1000 Mg Caps (Omega-3 fatty acids) .... Take 1 tablet by mouth once a day 5)  Mega Coq10 30 Mg Caps (Coenzyme q10) .... Take 1 tablet by mouth once a day 6)  Glucosamine-chondroitin 1500-1200 Mg/87ml Liqd (Glucosamine-chondroitin) .... Take 1 tablet by mouth once a day 7)  Calcium 600/vitamin D 600-400 Mg-unit Chew (Calcium carbonate-vitamin d) .... Take 1 tablet by mouth once a day 8)  B Complex Tabs (B complex vitamins) .... Take 1 tablet by mouth once a day 9)  Alprazolam 0.5 Mg Tabs (Alprazolam) .... 1/2 to 1 tab by mouth three times  a day as needed for nerves...  Patient Instructions: 1)  Today we updated your med list- see below.... 2)  We refilled your meds for 2012... 3)  Today we did your follow up CXR... please return to our office one morning next week for your FASTING blood work...  then please call the "phone tree" in a few days for your lab results.Marland KitchenMarland Kitchen  4)  Stay away from caffeine, & try to de-stress your life... 5)  Call for any problems.Marland KitchenMarland Kitchen 6)  Please schedule a follow-up appointment in 1 year, sooner as needed. Prescriptions: ALPRAZOLAM 0.5 MG TABS (ALPRAZOLAM) 1/2 to 1 tab by mouth three times a day as needed for nerves...  #100 x 6   Entered and Authorized by:   Michele Mcalpine MD   Signed by:   Michele Mcalpine MD on 11/01/2010   Method used:   Print then Give to Patient   RxID:   [id number] SIMVASTATIN 20 MG  TABS (SIMVASTATIN) Take 1 tablet by mouth once a day  #90 x 4   Entered and Authorized by:   Michele Mcalpine MD   Signed by:   Michele Mcalpine MD on 11/01/2010   Method used:   Print then Give to Patient   RxID:   [id number] METOPROLOL SUCCINATE 50 MG  TB24 (METOPROLOL SUCCINATE) Take 1 tablet by mouth once a day  #90 x 4   Entered and Authorized by:    Michele Mcalpine MD   Signed by:   Michele Mcalpine MD on 11/01/2010   Method used:   Print then Give to Patient   RxID:   [id number]    Immunization History:  Influenza Immunization History:    Influenza:  historical (08/02/2009)

## 2011-01-03 LAB — BASIC METABOLIC PANEL
CO2: 32 mEq/L (ref 19–32)
Calcium: 9.4 mg/dL (ref 8.4–10.5)
Chloride: 105 mEq/L (ref 96–112)
GFR calc Af Amer: >60 mL/min (ref >60)
Sodium: 140 mEq/L (ref 135–145)

## 2011-03-05 NOTE — Assessment & Plan Note (Signed)
[facility]                             PULMONARY OFFICE NOTE   NAME:BISHOPUnity, Luepke                    MRN:          [id number]  DATE:08/11/2007                            DOB:          1941-03-16    HISTORY OF PRESENT ILLNESS:  Patient is a 69 year old white female  patient of Dr. Jodelle Green who has a known history of fibromyalgia,  hyperlipidemia, and low back pain, who presents today for an acute  office visit.  Patient complains that over the last week she has had  lower back pain, increased on the right.  She was diagnosed with  sciatica in the emergency room, initially seen one week ago.  Patient  reports they gave her some Vicodin.  Patient's symptoms did not improve.  She was subsequently seen again two days later and underwent a recent  MRI in which she was told she had a bulging disk.  Patient reports the  pain continues.  She has run out of her Vicodin.   Patient denies any known injury, chest pain, palpitations, abdominal  pain, nausea, vomiting, or radicular symptoms.  Denies any urinary or  bowel incontinence.   PAST MEDICAL HISTORY:  1. Fibromyalgia.  2. Low back pain.  3. Hyperlipidemia, on simvastatin.  4. Osteopenia.  5. Mitral valve prolapse, palpitations.   CURRENT MEDICATIONS:  1. Simvastatin 20 mg nightly.  2. Metoprolol 50 mg daily.  3. Actonel weekly.  4. Aspirin 81 mg daily.  5. Calcium with vitamin D b.i.d.   DRUG ALLERGIES:  No known drug allergies.   PHYSICAL EXAMINATION:  Patient is a pleasant female in no acute  distress.  She is afebrile with stable vital signs.  HEENT:  Unremarkable.  NECK:  Supple without cervical adenopathy.  No JVD.  LUNGS:  Lung sounds are clear.  CARDIAC:  Regular rate and rhythm.  ABDOMEN:  Soft and nontender.  No palpable hepatosplenomegaly.  No  guarding or rebound.  No abdominal bruits or masses appreciated.  EXTREMITIES:  Warm without any calf cyanosis, clubbing, or edema.   Moves  all extremities well.  Equal strength at the upper extremities.  Slightly decreased strength in the right lower extremity.  Negative  straight leg raises.  Patient has normal DCRs.  Patient has tenderness  along the right lumbosacral region and positive tenderness over the SI  joint.  Hip range of motion is decreased on the right with reproducible  symptoms.   DATA:  MRI on October 14th revealed a right foraminal protrusion at L3-  4, superimposed on disk bulge with disk contracting the existing L3 root  with multilevel facet degenerative changes at L3-4.   IMPRESSION/PLAN:  Lower back pain with degenerative changes and disk  protrusion on L3-4.  Patient will be referred over to orthopedics for  further evaluation and treatment options.  Patient will begin Motrin 800  mg t.i.d. with food x7-10 days.  Add in Skelaxin as needed for muscle  spasm.  May use Vicodin #30, was given without any refills, using 1-2  every 4-6 hours.  Patient is to alternate ice and heat  to the area.  Patient will follow up with orthopedics, as recommended.  Patient is to  contact sooner if symptoms do not improve or worsen.      Rubye Oaks, NP  Electronically Signed      Lonzo Cloud. Kriste Basque, MD  Electronically Signed   TP/MedQ  DD: 08/11/2007  DT: 08/12/2007  Job #: [id number]

## 2011-05-13 ENCOUNTER — Other Ambulatory Visit: Payer: Self-pay | Admitting: Pulmonary Disease

## 2011-05-17 ENCOUNTER — Other Ambulatory Visit: Payer: Self-pay | Admitting: Pulmonary Disease

## 2011-12-13 ENCOUNTER — Other Ambulatory Visit: Payer: Self-pay | Admitting: Pulmonary Disease

## 2012-01-17 ENCOUNTER — Other Ambulatory Visit: Payer: Self-pay | Admitting: Neurosurgery

## 2012-01-17 DIAGNOSIS — M5126 Other intervertebral disc displacement, lumbar region: Secondary | ICD-10-CM

## 2012-01-29 ENCOUNTER — Other Ambulatory Visit: Payer: Self-pay | Admitting: Neurosurgery

## 2012-01-29 ENCOUNTER — Ambulatory Visit
Admission: RE | Admit: 2012-01-29 | Discharge: 2012-01-29 | Disposition: A | Discharge status: Home / Self Care | Payer: Medicare Other | Source: Ambulatory Visit | Attending: Neurosurgery | Admitting: Neurosurgery

## 2012-01-29 DIAGNOSIS — M419 Scoliosis, unspecified: Secondary | ICD-10-CM

## 2012-08-04 ENCOUNTER — Other Ambulatory Visit: Payer: Self-pay | Admitting: Obstetrics & Gynecology

## 2012-08-04 DIAGNOSIS — Z1231 Encounter for screening mammogram for malignant neoplasm of breast: Secondary | ICD-10-CM

## 2012-08-18 ENCOUNTER — Encounter: Payer: Self-pay | Admitting: Gastroenterology

## 2012-09-02 ENCOUNTER — Ambulatory Visit
Admission: RE | Admit: 2012-09-02 | Discharge: 2012-09-02 | Disposition: A | Discharge status: Home / Self Care | Payer: Medicare Other | Source: Ambulatory Visit | Attending: Obstetrics & Gynecology | Admitting: Obstetrics & Gynecology

## 2012-09-02 DIAGNOSIS — Z1231 Encounter for screening mammogram for malignant neoplasm of breast: Secondary | ICD-10-CM

## 2012-09-10 ENCOUNTER — Ambulatory Visit (INDEPENDENT_AMBULATORY_CARE_PROVIDER_SITE_OTHER): Payer: Medicare Other | Admitting: Gastroenterology

## 2012-09-10 ENCOUNTER — Encounter: Payer: Self-pay | Admitting: Gastroenterology

## 2012-09-10 VITALS — BP 120/60 | HR 66 | Ht 60.5 in | Wt 123.8 lb

## 2012-09-10 DIAGNOSIS — K5900 Constipation, unspecified: Secondary | ICD-10-CM

## 2012-09-10 DIAGNOSIS — R195 Other fecal abnormalities: Secondary | ICD-10-CM

## 2012-09-10 MED ORDER — PEG-KCL-NACL-NASULF-NA ASC-C 100 G PO SOLR: 1.0000 | Freq: Once | ORAL | Status: DC

## 2012-09-10 NOTE — Progress Notes (Signed)
History of Present Illness: This is a 71 year old female with Hemoccult positive stool. She has chronic constipation and uses a stool softener on a daily basis and colon cleanse laxative about every week. She notes for bloating and abdominal discomfort when she becomes constipated. She occasionally notes small amounts of bright red blood per rectum with hard stools. Recent screening Hemoccults were positive. She underwent colonoscopy in April 2009 that showed a tortuous colon but was otherwise remarkable. Denies weight loss, abdominal pain, diarrhea, change in stool caliber, melena, hematochezia, nausea, vomiting, dysphagia, reflux symptoms, chest pain.   Review of Systems: Pertinent positive and negative review of systems were noted in the above HPI section. All other review of systems were otherwise negative. Current Medications, Allergies, Past Medical History, Past Surgical History, Family History and Social History were reviewed in [employer] record.  Physical Exam: General: Well developed , well nourished, no acute distress Head: Normocephalic and atraumatic Eyes:  sclerae anicteric, EOMI Ears: Normal auditory acuity Mouth: No deformity or lesions Neck: Supple, no masses or thyromegaly Lungs: Clear throughout to auscultation Heart: Regular rate and rhythm; no murmurs, rubs or bruits Abdomen: Soft, non tender and non distended. No masses, hepatosplenomegaly or hernias noted. Normal Bowel sounds Rectal: deferred to colonoscopy Musculoskeletal: Symmetrical with no gross deformities  Skin: No lesions on visible extremities Pulses:  Normal pulses noted Extremities: No clubbing, cyanosis, edema or deformities noted Neurological: Alert oriented x 4, grossly nonfocal Cervical Nodes:  No significant cervical adenopathy Inguinal Nodes: No significant inguinal adenopathy Psychological:  Alert and cooperative. Normal mood and affect  Assessment and Recommendations:  1.  Hemoccult positive stool, occasional small volume hematochezia and chronic constipation. Last colonoscopy was 4-1/2 years ago. I suspect a benign anorectal source of occult bleeding such as hemorrhoids however colorectal presents should excluded with colonoscopy. Trial of MiraLax 1-3 times daily titrated for adequate bowel movements and attempt to discontinue colon cleanse laxative. The risks, benefits, and alternatives to colonoscopy with possible biopsy and possible polypectomy were discussed with the patient and they consent to proceed.

## 2012-09-10 NOTE — Patient Instructions (Addendum)
You have been scheduled for a colonoscopy with propofol. Please follow written instructions given to you at your visit today.  Please pick up your prep kit at the pharmacy within the next 1-3 days. If you use inhalers (even only as needed) or a CPAP machine, please bring them with you on the day of your procedure.  You can start Miralax over the counter mixing 17 grams in 8 oz of water 1-3 x daily to titrate depending on your bowel movements.  Please try to stop taking Colon Cleanse.  cc: Varney Baas, MD

## 2012-10-22 ENCOUNTER — Ambulatory Visit (AMBULATORY_SURGERY_CENTER): Payer: Medicare Other | Admitting: Gastroenterology

## 2012-10-22 ENCOUNTER — Encounter: Payer: Self-pay | Admitting: Gastroenterology

## 2012-10-22 VITALS — BP 151/73 | HR 63 | Temp 97.6°F | Resp 21 | Ht 60.5 in | Wt 123.0 lb

## 2012-10-22 DIAGNOSIS — R195 Other fecal abnormalities: Secondary | ICD-10-CM

## 2012-10-22 MED ORDER — SODIUM CHLORIDE 0.9 % IV SOLN: 500.0000 mL | INTRAVENOUS | Status: DC

## 2012-10-22 NOTE — Progress Notes (Signed)
Patient did not experience any of the following events: a burn prior to discharge; a fall within the facility; wrong site/side/patient/procedure/implant event; or a hospital transfer or hospital admission upon discharge from the facility. (G8907) Patient did not have preoperative order for IV antibiotic SSI prophylaxis. (G8918)  

## 2012-10-22 NOTE — Patient Instructions (Addendum)
YOU HAD AN ENDOSCOPIC PROCEDURE TODAY AT THE [facility]: Refer to the procedure report that was given to you for any specific questions about what was found during the examination.  If the procedure report does not answer your questions, please call your gastroenterologist to clarify.  If you requested that your care partner not be given the details of your procedure findings, then the procedure report has been included in a sealed envelope for you to review at your convenience later.  YOU SHOULD EXPECT: Some feelings of bloating in the abdomen. Passage of more gas than usual.  Walking can help get rid of the air that was put into your GI tract during the procedure and reduce the bloating. If you had a lower endoscopy (such as a colonoscopy or flexible sigmoidoscopy) you may notice spotting of blood in your stool or on the toilet paper. If you underwent a bowel prep for your procedure, then you may not have a normal bowel movement for a few days.  DIET: Your first meal following the procedure should be a light meal and then it is ok to progress to your normal diet.  A half-sandwich or bowl of soup is an example of a good first meal.  Heavy or fried foods are harder to digest and may make you feel nauseous or bloated.  Likewise meals heavy in dairy and vegetables can cause extra gas to form and this can also increase the bloating.  Drink plenty of fluids but you should avoid alcoholic beverages for 24 hours.  ACTIVITY: Your care partner should take you home directly after the procedure.  You should plan to take it easy, moving slowly for the rest of the day.  You can resume normal activity the day after the procedure however you should NOT DRIVE or use heavy machinery for 24 hours (because of the sedation medicines used during the test).    SYMPTOMS TO REPORT IMMEDIATELY: A gastroenterologist can be reached at any hour.  During normal business hours, 8:30 AM to 5:00 PM Monday through Friday,  call [phone].  After hours and on weekends, please call the GI answering service at [phone] who will take a message and have the physician on call contact you.   Following lower endoscopy (colonoscopy or flexible sigmoidoscopy):  Excessive amounts of blood in the stool  Significant tenderness or worsening of abdominal pains  Swelling of the abdomen that is new, acute  Fever of 100F or higher    FOLLOW UP: If any biopsies were taken you will be contacted by phone or by letter within the next 1-3 weeks.  Call your gastroenterologist if you have not heard about the biopsies in 3 weeks.  Our staff will call the home number listed on your records the next business day following your procedure to check on you and address any questions or concerns that you may have at that time regarding the information given to you following your procedure. This is a courtesy call and so if there is no answer at the home number and we have not heard from you through the emergency physician on call, we will assume that you have returned to your regular daily activities without incident.  SIGNATURES/CONFIDENTIALITY: You and/or your care partner have signed paperwork which will be entered into your electronic medical record.  These signatures attest to the fact that that the information above on your After Visit Summary has been reviewed and is understood.  Full responsibility of the confidentiality   of this discharge information lies with you and/or your care-partner.     

## 2012-10-22 NOTE — Progress Notes (Signed)
Propofol given over incremental dosages 

## 2012-10-22 NOTE — Op Note (Signed)
[facility] [address] [place], [address]   COLONOSCOPY PROCEDURE REPORT  PATIENT: Jill, Larson  MR#: [id number] BIRTHDATE: 1941-04-06 , 71  yrs. old GENDER: Female ENDOSCOPIST: Meryl Dare, MD, [facility] PROCEDURE DATE:  10/22/2012 PROCEDURE:   Colonoscopy, diagnostic ASA CLASS:   Class II INDICATIONS:heme-positive stool. MEDICATIONS: MAC sedation, administered by CRNA and propofol (Diprivan) 100mg  IV  DESCRIPTION OF PROCEDURE:   After the risks benefits and alternatives of the procedure were thoroughly explained, informed consent was obtained.  A digital rectal exam revealed no abnormalities of the rectum.   The Colonoscope  endoscope was introduced through the anus and advanced to the cecum, which was identified by both the appendix and ileocecal valve. No adverse events experienced.   The quality of the prep was excellent, using MoviPrep  The instrument was then slowly withdrawn as the colon was fully examined.    COLON FINDINGS: Images taken but only available in hard copy form that will be scanned into EPIC [employer]).   A normal appearing cecum, ileocecal valve, and appendiceal orifice were identified. The ascending, hepatic flexure, transverse, splenic flexure, descending, sigmoid colon and rectum appeared unremarkable.  No polyps or cancers were seen.  Retroflexed views revealed no abnormalities. The time to cecum=4 minutes 16 seconds.  Withdrawal time=7 minutes 14 seconds.  The scope was withdrawn and the procedure completed.  COMPLICATIONS: There were no complications.  ENDOSCOPIC IMPRESSION: 1.   Images taken but only available in hard copy form that will be scanned into EPIC [employer]). 2.   Normal colon  RECOMMENDATIONS: 1. Continue current colorectal screening recommendations for "routine risk" patients with a repeat colonoscopy in 10 years.   eSigned:  Meryl Dare, MD, Clementeen Graham 10/22/2012 2:26 PM   cc: Varney Baas,  MD

## 2012-10-23 ENCOUNTER — Telehealth: Payer: Self-pay | Admitting: *Deleted

## 2012-10-23 NOTE — Telephone Encounter (Signed)
  Follow up Call-  Call back number 10/22/2012  Post procedure Call Back phone  # [phone]  Permission to leave phone message Yes     Patient questions:  Do you have a fever, pain , or abdominal swelling? no Pain Score  0 *  Have you tolerated food without any problems? yes  Have you been able to return to your normal activities? yes  Do you have any questions about your discharge instructions: Diet   no Medications  no Follow up visit  no  Do you have questions or concerns about your Care? no  Actions: * If pain score is 4 or above: No action needed, pain <4.

## 2012-10-26 ENCOUNTER — Encounter: Payer: Self-pay | Admitting: Gastroenterology

## 2014-02-11 DIAGNOSIS — M5430 Sciatica, unspecified side: Secondary | ICD-10-CM | POA: Insufficient documentation

## 2014-08-18 ENCOUNTER — Ambulatory Visit (INDEPENDENT_AMBULATORY_CARE_PROVIDER_SITE_OTHER): Payer: Medicare Other

## 2014-08-18 DIAGNOSIS — Q828 Other specified congenital malformations of skin: Secondary | ICD-10-CM

## 2014-08-18 DIAGNOSIS — R52 Pain, unspecified: Secondary | ICD-10-CM

## 2014-08-18 DIAGNOSIS — M2042 Other hammer toe(s) (acquired), left foot: Secondary | ICD-10-CM

## 2014-08-18 NOTE — Progress Notes (Addendum)
   Subjective:    Patient ID: Jill Larson, female    DOB: [birth year], 73 y.o.   MRN: [id number]  HPI  PT STATED LT FOOT 5TH TOENAIL IS HURTING FOR FOR 2 YEARS. THE TOENAIL IS GETTING WORSE AND GET AGGRAVATED BY PRESSURE. TRIED NO TREATMENT.  Review of Systems  HENT: Positive for sinus pressure.   All other systems reviewed and are negative.      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 18 months ago since she had last debrided and has been painful some dramatic for a couple weeks now. Wounds no ulcers no secondary infections physical history of fibromyalgia arthropathy elevated cholesterol his treatment anxiety and lumbar radiculopathy.  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 Follow-up as needed next para Claudette Stapler DPM

## 2014-08-18 NOTE — Addendum Note (Signed)
Addended by: Elta Guadeloupe on: 08/18/2014 04:40 PM   Modules accepted: Orders

## 2014-08-18 NOTE — Patient Instructions (Signed)
Corns and Calluses Corns are small areas of thickened skin that usually occur on the top, sides, or tip of a toe. They contain a cone-shaped core with a point that can press on a nerve below. This causes pain. Calluses are areas of thickened skin that usually develop on hands, fingers, palms, soles of the feet, and heels. These are areas that experience frequent friction or pressure. CAUSES  Corns are usually the result of rubbing (friction) or pressure from shoes that are too tight or do not fit properly. Calluses are caused by repeated friction and pressure on the affected areas. SYMPTOMS  A hard growth on the skin.  Pain or tenderness under the skin.  Sometimes, redness and swelling.  Increased discomfort while wearing tight-fitting shoes. DIAGNOSIS  Your caregiver can usually tell what the problem is by doing a physical exam. TREATMENT  Removing the cause of the friction or pressure is usually the only treatment needed. However, sometimes medicines can be used to help soften the hardened, thickened areas. These medicines include salicylic acid plasters and 12% ammonium lactate lotion. These medicines should only be used under the direction of your caregiver. HOME CARE INSTRUCTIONS   Try to remove pressure from the affected area.  You may wear donut-shaped corn pads to protect your skin.  You may use a pumice stone or nonmetallic nail file to gently reduce the thickness of a corn.  Wear properly fitted footwear.  If you have calluses on the hands, wear gloves during activities that cause friction.  If you have diabetes, you should regularly examine your feet. Tell your caregiver if you notice any problems with your feet. SEEK IMMEDIATE MEDICAL CARE IF:   You have increased pain, swelling, redness, or warmth in the affected area.  Your corn or callus starts to drain fluid or bleeds.  You are not getting better, even with treatment. Document Released: 07/13/2004 Document  Revised: 12/30/2011 Document Reviewed: 06/04/2011 ExitCare Patient Information 2015 [employer]. This information is not intended to replace advice given to you by your health care provider. Make sure you discuss any questions you have with your health care provider.  

## 2014-11-03 ENCOUNTER — Other Ambulatory Visit: Payer: Self-pay | Admitting: Orthopaedic Surgery

## 2014-11-03 DIAGNOSIS — M41125 Adolescent idiopathic scoliosis, thoracolumbar region: Secondary | ICD-10-CM

## 2014-11-22 ENCOUNTER — Ambulatory Visit
Admission: RE | Admit: 2014-11-22 | Discharge: 2014-11-22 | Disposition: A | Discharge status: Home / Self Care | Payer: Medicare Other | Source: Ambulatory Visit | Attending: Orthopaedic Surgery | Admitting: Orthopaedic Surgery

## 2014-11-22 DIAGNOSIS — M41125 Adolescent idiopathic scoliosis, thoracolumbar region: Secondary | ICD-10-CM

## 2014-12-06 ENCOUNTER — Other Ambulatory Visit: Payer: Self-pay | Admitting: Orthopedic Surgery

## 2014-12-06 DIAGNOSIS — M810 Age-related osteoporosis without current pathological fracture: Secondary | ICD-10-CM

## 2014-12-07 ENCOUNTER — Other Ambulatory Visit: Payer: Self-pay | Admitting: Orthopedic Surgery

## 2014-12-07 DIAGNOSIS — G8929 Other chronic pain: Secondary | ICD-10-CM

## 2014-12-07 DIAGNOSIS — M419 Scoliosis, unspecified: Secondary | ICD-10-CM

## 2014-12-07 DIAGNOSIS — M545 Low back pain: Principal | ICD-10-CM

## 2014-12-15 ENCOUNTER — Ambulatory Visit
Admission: RE | Admit: 2014-12-15 | Discharge: 2014-12-15 | Disposition: A | Discharge status: Home / Self Care | Payer: Medicare Other | Source: Ambulatory Visit | Attending: Orthopedic Surgery | Admitting: Orthopedic Surgery

## 2014-12-15 DIAGNOSIS — M545 Low back pain, unspecified: Secondary | ICD-10-CM

## 2014-12-15 DIAGNOSIS — M419 Scoliosis, unspecified: Secondary | ICD-10-CM

## 2014-12-15 DIAGNOSIS — G8929 Other chronic pain: Secondary | ICD-10-CM

## 2014-12-15 MED ORDER — DIAZEPAM 5 MG PO TABS
5.0000 mg | ORAL_TABLET | Freq: Once | ORAL | Status: AC
  Administered 2014-12-15: 5 mg via ORAL

## 2014-12-15 MED ORDER — IOHEXOL 300 MG/ML  SOLN
10.0000 mL | Freq: Once | INTRAMUSCULAR | Status: AC | PRN
  Administered 2014-12-15: 10 mL via INTRATHECAL

## 2014-12-15 NOTE — Discharge Instructions (Signed)
[facility] Discharge Instructions  1. Go home and rest quietly for the next 24 hours.  It is important to lie flat for the next 24 hours.  Get up only to go to the restroom.  You may lie in the bed or on a couch on your back, your stomach, your left side or your right side.  You may have one pillow under your head.  You may have pillows between your knees while you are on your side or under your knees while you are on your back.  2. DO NOT drive today.  Recline the seat as far back as it will go, while still wearing your seat belt, on the way home.  3. You may get up to go to the bathroom as needed.  You may sit up for 10 minutes to eat.  You may resume your normal diet and medications unless otherwise indicated.  Drink plenty of extra fluids today and tomorrow.  4. The incidence of a spinal headache with nausea and/or vomiting is about 5% (one in 20 patients).  If you develop a headache, lie flat and drink plenty of fluids until the headache goes away.  Caffeinated beverages may be helpful.  If you develop severe nausea and vomiting or a headache that does not go away with flat bed rest, call [phone].  5. You may resume normal activities after your 24 hours of bed rest is over; however, do not exert yourself strongly or do any heavy lifting tomorrow.  6. Call your physician for a follow-up appointment.   You may resume Escitalopram on Friday, December 16, 2014 after 11:00a.m.

## 2015-03-11 DIAGNOSIS — F329 Major depressive disorder, single episode, unspecified: Secondary | ICD-10-CM | POA: Insufficient documentation

## 2015-03-11 DIAGNOSIS — M545 Low back pain, unspecified: Secondary | ICD-10-CM | POA: Insufficient documentation

## 2015-03-11 DIAGNOSIS — R198 Other specified symptoms and signs involving the digestive system and abdomen: Secondary | ICD-10-CM

## 2015-03-11 DIAGNOSIS — K56609 Unspecified intestinal obstruction, unspecified as to partial versus complete obstruction: Secondary | ICD-10-CM

## 2015-03-11 DIAGNOSIS — M4310 Spondylolisthesis, site unspecified: Secondary | ICD-10-CM

## 2015-03-11 DIAGNOSIS — I2699 Other pulmonary embolism without acute cor pulmonale: Secondary | ICD-10-CM | POA: Insufficient documentation

## 2015-03-11 DIAGNOSIS — F32A Depression, unspecified: Secondary | ICD-10-CM

## 2015-03-11 DIAGNOSIS — G8929 Other chronic pain: Secondary | ICD-10-CM | POA: Insufficient documentation

## 2015-03-11 HISTORY — DX: Spondylolisthesis, site unspecified: M43.10

## 2015-03-11 HISTORY — DX: Other specified symptoms and signs involving the digestive system and abdomen: R19.8

## 2015-03-11 HISTORY — DX: Other pulmonary embolism without acute cor pulmonale: I26.99

## 2015-03-11 HISTORY — DX: Depression, unspecified: F32.A

## 2015-03-11 HISTORY — DX: Unspecified intestinal obstruction, unspecified as to partial versus complete obstruction: K56.609

## 2015-03-11 HISTORY — DX: Other chronic pain: G89.29

## 2015-03-11 HISTORY — DX: Low back pain, unspecified: M54.50

## 2015-09-01 ENCOUNTER — Telehealth: Payer: Self-pay | Admitting: Pulmonary Disease

## 2015-09-01 NOTE — Telephone Encounter (Signed)
lmtcb x1 for pt. There is no documentation where we called her. She has not been seen in the office since 2012.

## 2015-09-04 NOTE — Telephone Encounter (Signed)
lmtcb x2 for pt. 

## 2015-09-05 NOTE — Telephone Encounter (Signed)
Called and spoke to pt. Pt stated she was calling on behalf of her husband, Jill Larson, who also see's SN. All issues were addressed and Jill Larson states nothing further needed.

## 2015-11-23 ENCOUNTER — Encounter: Payer: Self-pay | Admitting: Sports Medicine

## 2015-11-23 ENCOUNTER — Ambulatory Visit (INDEPENDENT_AMBULATORY_CARE_PROVIDER_SITE_OTHER): Payer: Medicare Other | Admitting: Sports Medicine

## 2015-11-23 DIAGNOSIS — E65 Localized adiposity: Secondary | ICD-10-CM

## 2015-11-23 DIAGNOSIS — Q828 Other specified congenital malformations of skin: Secondary | ICD-10-CM

## 2015-11-23 DIAGNOSIS — M79672 Pain in left foot: Secondary | ICD-10-CM

## 2015-11-23 DIAGNOSIS — M2170 Unequal limb length (acquired), unspecified site: Secondary | ICD-10-CM

## 2015-11-23 DIAGNOSIS — M2040 Other hammer toe(s) (acquired), unspecified foot: Secondary | ICD-10-CM

## 2015-11-23 NOTE — Progress Notes (Signed)
Patient ID: Jill Larson, female   DOB: 1941/04/16, 75 y.o.   MRN: [id number] Subjective: Jill Larson is a 75 y.o. female patient who presents to office for evaluation of Left foot pain. Patient complains of pain at the lesions present Left foot at the ball.  Patient states that they looked similar to warts she has tried over-the-counter wart treatments with no resolution also remembers in the past coming to Dr. Blenda Mounts to have these lesions taken care of as well states that she has also tried moisturizing cream pumice stone but lesions continue to build up and are painful. Patient denies any other pedal complaints.   Patient Active Problem List   Diagnosis Date Noted  . OTHER DISEASES OF LUNG NOT ELSEWHERE CLASSIFIED 10/30/2009  . GASTRIC POLYP 06/19/2008  . PALPITATIONS 06/14/2008  . HYPERCHOLESTEROLEMIA 04/18/2008  . ANXIETY 04/18/2008  . HYPERTENSION, BORDERLINE 04/18/2008  . CONSTIPATION 04/18/2008  . BACK PAIN, LUMBAR 04/18/2008  . 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 OSTEOPOROSIS 04/18/2008  . MITRAL VALVE PROLAPSE, HX OF 04/18/2008   Current Outpatient Prescriptions on File Prior to Visit  Medication Sig Dispense Refill  . alendronate (FOSAMAX) 70 MG tablet     . ALPRAZolam (XANAX) 0.5 MG tablet TAKE ONE-HALF TO ONE TABLET BY MOUTH THREE TIMES DAILY AS NEEDED FOR NERVES 100 tablet 1  . b complex vitamins capsule Take 1 capsule by mouth daily.    . Cholecalciferol (VITAMIN D PO) Take 1 capsule by mouth daily.    Marland Kitchen escitalopram (LEXAPRO) 10 MG tablet Take 1 tablet by mouth daily.    . Omega-3 Fatty Acids (FISH OIL PO) Take 1 capsule by mouth daily.     No current facility-administered medications on file prior to visit.   No Known Allergies   Objective:  General: Alert and oriented x3 in no acute distress  Dermatology: Keratotic lesion present sub met 2 and 5 on left with skin lines transversing the lesion, pain is present with direct pressure to the lesion, central nucleated core  noted suggestive of porokeratosis, no webspace macerations, no ecchymosis bilateral, all nails x 10 are well manicured.  Vascular: Dorsalis Pedis 1/4 and Posterior Tibial pedal pulses 2/4, Capillary Fill Time 3 seconds, + pedal hair growth bilateral, no edema bilateral lower extremities, Temperature gradient within normal limits.  Neurology: Johney Maine sensation intact via light touch bilateral.  Musculoskeletal: Mild tenderness with palpation at the lesion sites on Left, Muscular strength 5/5 in all groups without pain or limitation on range of motion. + Lesser hammertoe bilateral with distal fat pad migration and limb length inequality with the left leg being shorter than the right.  Assessment and Plan: Problem List Items Addressed This Visit    None    Visit Diagnoses    Porokeratosis    -  Primary    Left foot pain        Lower limb length difference        Left shorter    Hammer toe, unspecified laterality        Fat pad syndrome          -Complete examination performed -Discussed treatment options for Porokeratosis and limb inequality -Parred keratoic lesions/keratosis using a chisel blade; treated the area with Salinocaine covered with Moleskin -Recommend daily skin emollients and use a pumice stone as needed -Recommend good supportive shoes and inserts -Recommend heel lift for left shorter lower limb -Patient to return to office as needed or sooner if condition worsens.  Landis Martins, DPM

## 2015-11-30 ENCOUNTER — Ambulatory Visit (INDEPENDENT_AMBULATORY_CARE_PROVIDER_SITE_OTHER): Payer: Medicare Other | Admitting: Sports Medicine

## 2015-11-30 ENCOUNTER — Encounter: Payer: Self-pay | Admitting: Sports Medicine

## 2015-11-30 DIAGNOSIS — M79672 Pain in left foot: Secondary | ICD-10-CM

## 2015-11-30 DIAGNOSIS — M2040 Other hammer toe(s) (acquired), unspecified foot: Secondary | ICD-10-CM

## 2015-11-30 DIAGNOSIS — Q828 Other specified congenital malformations of skin: Secondary | ICD-10-CM | POA: Diagnosis not present

## 2015-11-30 DIAGNOSIS — E65 Localized adiposity: Secondary | ICD-10-CM

## 2015-11-30 DIAGNOSIS — M2170 Unequal limb length (acquired), unspecified site: Secondary | ICD-10-CM

## 2015-11-30 NOTE — Progress Notes (Signed)
Patient ID: Jill Larson, female   DOB: Sep 19, 1941, 75 y.o.   MRN: [id number] Subjective: Jill Larson is a 75 y.o. female patient who returns to office for evaluation of Left foot pain. Patient  States that after visit on last week she did not get any pain relief from the trimming of the lesion to the one to the ball of her left foot states that the other lesion feels fine in comparison.  Patient has been soaking using Epsom salt and using heel lift on left.  Patient denies any other pedal complaints.   Patient Active Problem List   Diagnosis Date Noted  . OTHER DISEASES OF LUNG NOT ELSEWHERE CLASSIFIED 10/30/2009  . GASTRIC POLYP 06/19/2008  . PALPITATIONS 06/14/2008  . HYPERCHOLESTEROLEMIA 04/18/2008  . ANXIETY 04/18/2008  . HYPERTENSION, BORDERLINE 04/18/2008  . CONSTIPATION 04/18/2008  . BACK PAIN, LUMBAR 04/18/2008  . 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 OSTEOPOROSIS 04/18/2008  . MITRAL VALVE PROLAPSE, HX OF 04/18/2008   Current Outpatient Prescriptions on File Prior to Visit  Medication Sig Dispense Refill  . alendronate (FOSAMAX) 70 MG tablet     . ALPRAZolam (XANAX) 0.5 MG tablet TAKE ONE-HALF TO ONE TABLET BY MOUTH THREE TIMES DAILY AS NEEDED FOR NERVES 100 tablet 1  . b complex vitamins capsule Take 1 capsule by mouth daily.    . Cholecalciferol (VITAMIN D PO) Take 1 capsule by mouth daily.    Marland Kitchen escitalopram (LEXAPRO) 10 MG tablet Take 1 tablet by mouth daily.    . Omega-3 Fatty Acids (FISH OIL PO) Take 1 capsule by mouth daily.     No current facility-administered medications on file prior to visit.   No Known Allergies   Objective:  General: Alert and oriented x3 in no acute distress  Dermatology: Keratotic lesion present sub met 2> 5 on left with skin lines transversing the lesion, pain is present with direct pressure to the lesion, central nucleated core noted suggestive of porokeratosis, no webspace macerations, no ecchymosis bilateral, all nails x 10 are well  manicured.  Vascular: Dorsalis Pedis 1/4 and Posterior Tibial pedal pulses 2/4, Capillary Fill Time 3 seconds, + pedal hair growth bilateral, no edema bilateral lower extremities, Temperature gradient within normal limits.  Neurology: Gross sensation intact via light touch bilateral.  Musculoskeletal: Mild tenderness with palpation at the lesion site sub met 2 on Left, No pain to lesion sub met 5 on left, Muscular strength 5/5 in all groups without pain or limitation on range of motion. + Lesser hammertoe bilateral with distal fat pad migration and limb length inequality with the left leg being shorter than the right.  Assessment and Plan: Problem List Items Addressed This Visit    None    Visit Diagnoses    Left foot pain    -  Primary    Porokeratosis        Lower limb length difference        Fat pad syndrome        Hammer toe, unspecified laterality          -Complete examination performed -Discussed treatment options for Porokeratosis and limb inequality -Parred keratoic lesion/keratosis sub met 2 using a chisel blade; treated the area with Salinocaine covered with band-aid and gave patient offloading pad and instructed on use. Lesion sub met 5 did not require debridement at today's encounter -Recommend daily skin emollients and use a pumice stone as needed -Recommend good supportive shoes and inserts -Recommend and gave patient heel lift for  left shorter lower limb -Patient to return to office as needed or sooner if condition worsens.  If the lesion recurs and continues to be symptomatic, May consider at next visit steroid injection underneath the lesion versus complete excision of lesion.  Landis Martins, DPM

## 2016-02-17 IMAGING — RF DG MYELOGRAPHY LUMBAR INJ MULTI REGION
13 of 24 series · 13 of 24 positions shown · non-contrast
Comparison: none

CLINICAL DATA: Low back pain. Idiopathic thoracolumbar scoliosis.
RIGHT thigh pain.
TECHNIQUE: Contiguous axial images were obtained through the Cervical,
Thoracic, and Lumbar spine after the intrathecal infusion of
infusion. Coronal and sagittal reconstructions were obtained of the
axial image sets.

[Series 1: (hospital) · 1 of 1 slices shown]
[im 1/1]
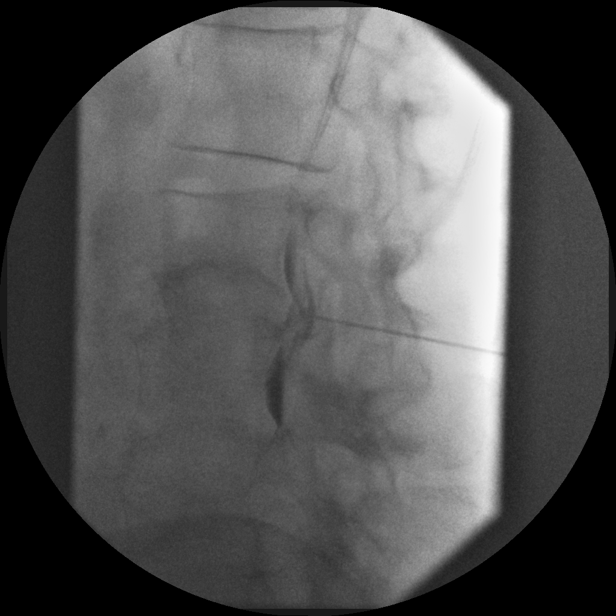

[Series 2: vasc standard · 1 of 4 slices shown]
[im 1/4]
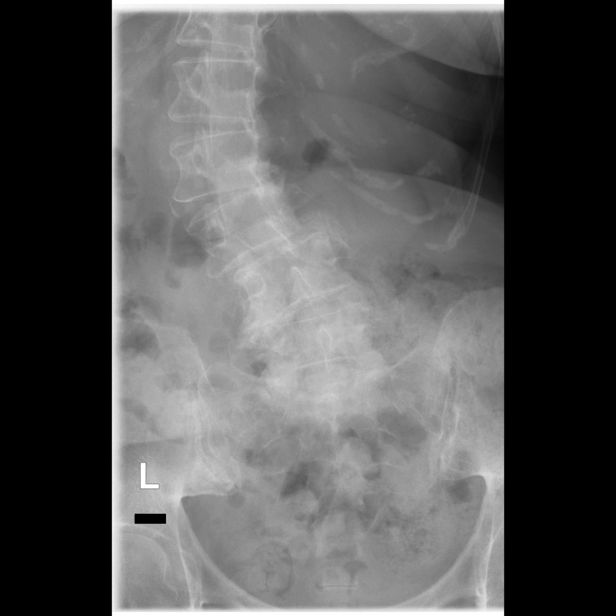

[Series 4: myelogram  white · 1 of 1 slices shown (1 of 11)]
[im 1/1]
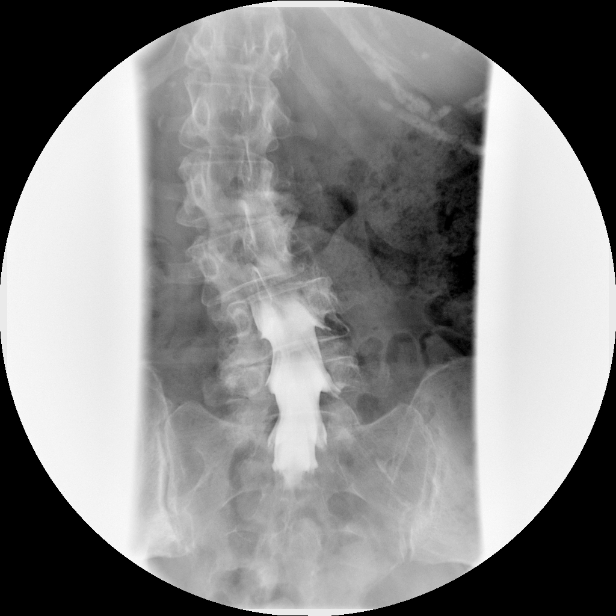

[Series 6: myelogram  white · 1 of 1 slices shown (2 of 11)]
[im 1/1]
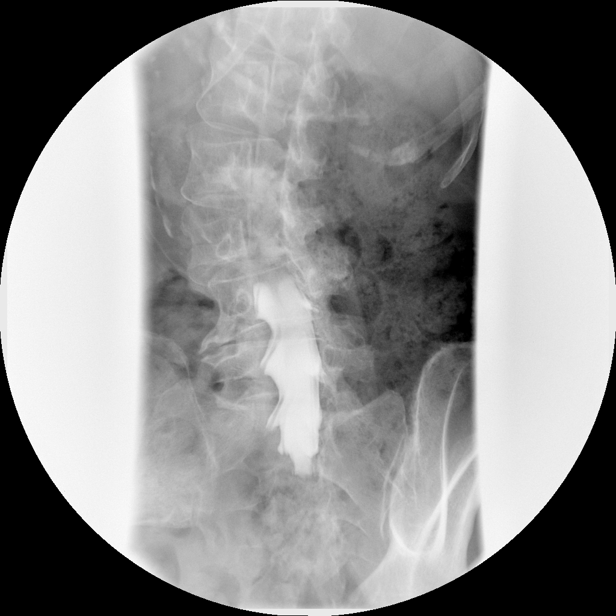

[Series 8: myelogram  white · 1 of 1 slices shown (3 of 11)]
[im 1/1]
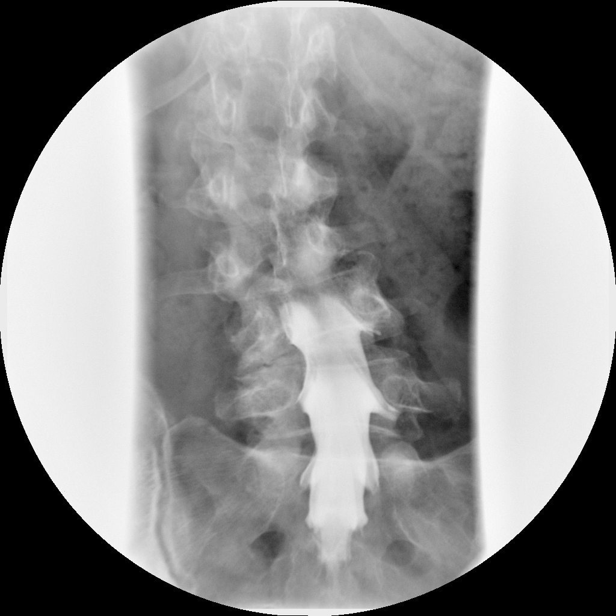

[Series 10: myelogram  white · 1 of 1 slices shown (4 of 11)]
[im 1/1]
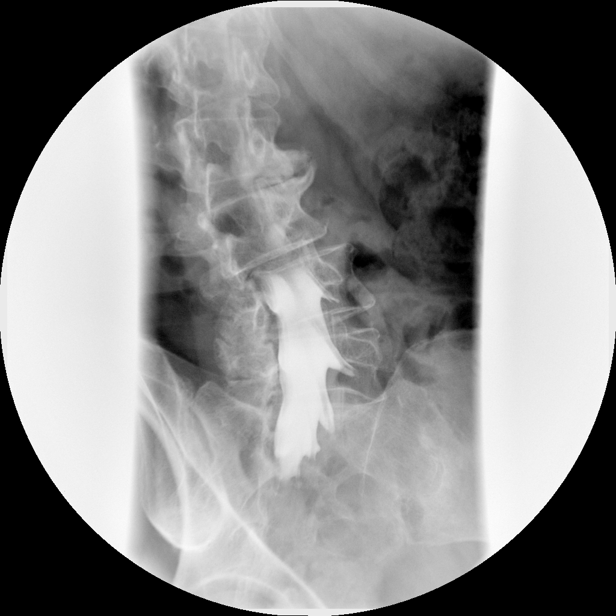

[Series 12: myelogram  white · 1 of 1 slices shown (5 of 11)]
[im 1/1]
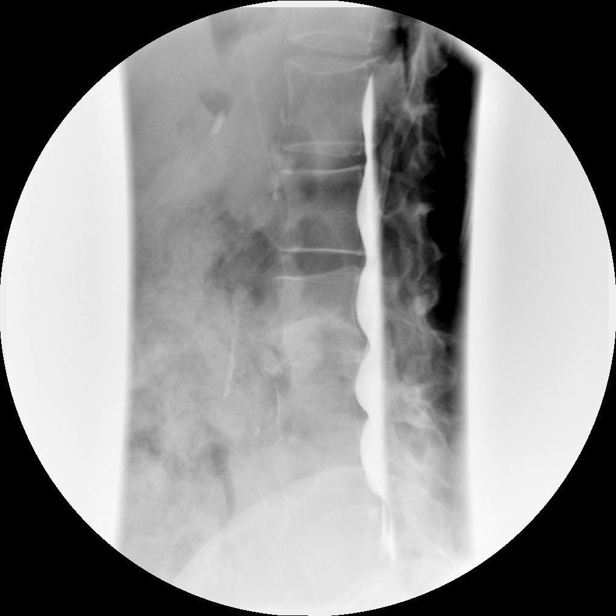

[Series 13: myelogram  white · 1 of 1 slices shown (6 of 11)]
[im 1/1]
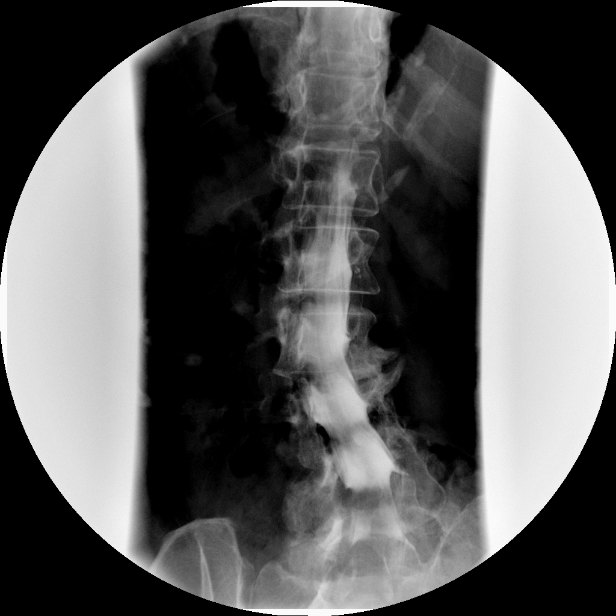

[Series 15: myelogram  white · 1 of 1 slices shown (7 of 11)]
[im 1/1]
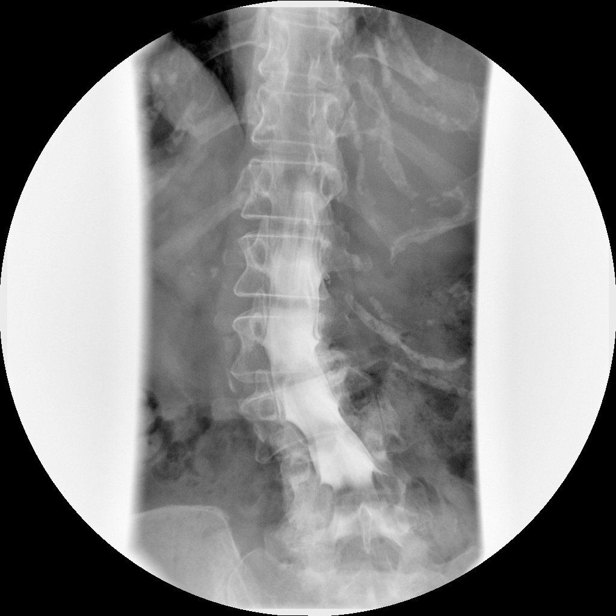

[Series 17: myelogram  white · 1 of 1 slices shown (8 of 11)]
[im 1/1]
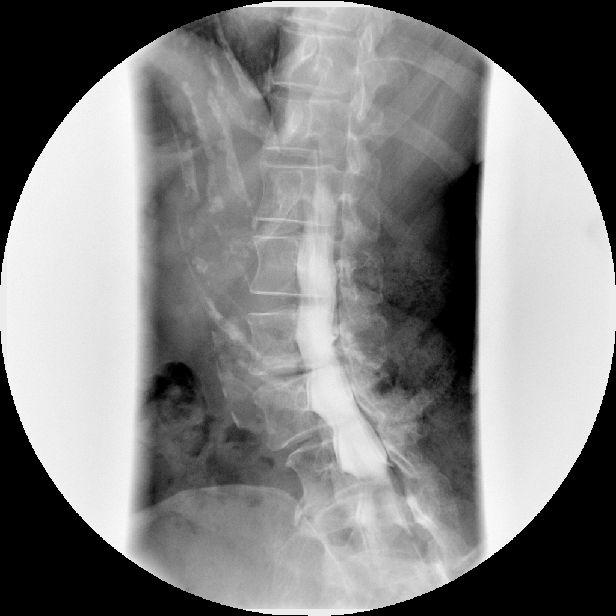

[Series 19: myelogram  white · 1 of 1 slices shown (9 of 11)]
[im 1/1]
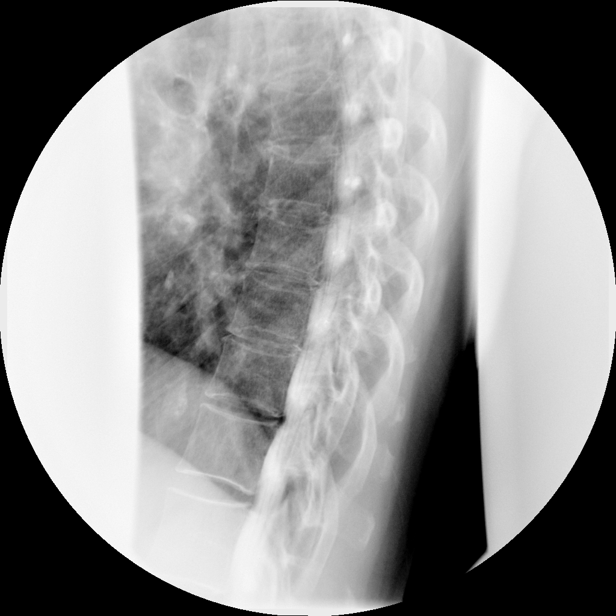

[Series 21: myelogram  white · 1 of 1 slices shown (10 of 11)]
[im 1/1]
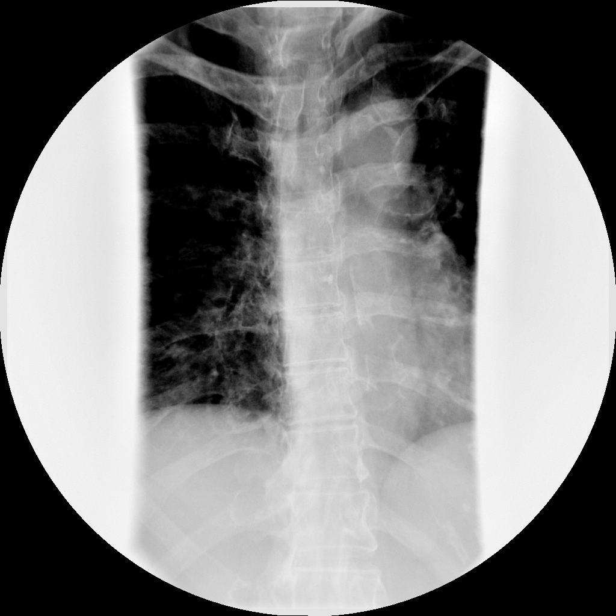

[Series 23: myelogram  white · 1 of 1 slices shown (11 of 11)]
[im 1/1]
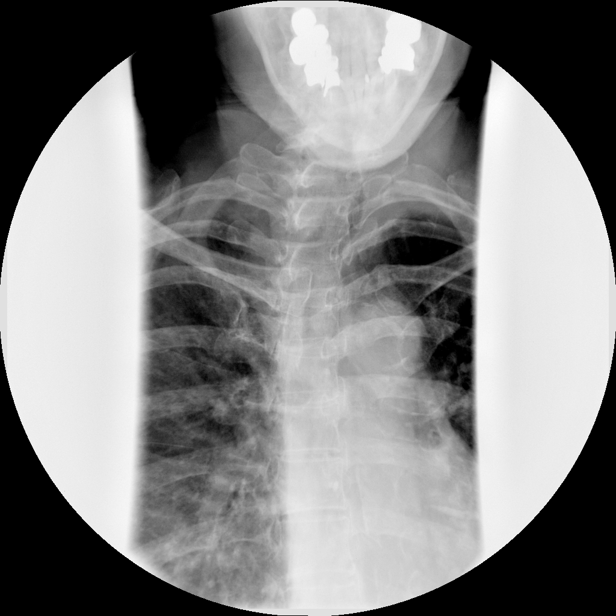

[13 of 24 positions shown; findings below may reference images not displayed]

FLUOROSCOPY TIME:  2 minutes and 4 seconds. Twenty-five images were
obtained.

PROCEDURE:
LUMBAR PUNCTURE FOR  LUMBAR AND THORACIC MYELOGRAM

LUMBAR AND THORACIC MYELOGRAM

CT LUMBAR MYELOGRAM

CT THORACIC MYELOGRAM

After thorough discussion of risks and benefits of the procedure
including bleeding, infection, injury to nerves, blood vessels,
adjacent structures as well as headache and CSF leak, written and
oral informed consent was obtained. Consent was obtained by Dr. Kul
Pascale.

Patient was positioned prone on the fluoroscopy table. Local
anesthesia was provided with 1% lidocaine without epinephrine after
prepped and draped in the usual sterile fashion. Puncture was
performed at L2-3 using a 3 1/2 inch 22-gauge spinal needle via
midline approach. Using a single pass through the dura, the needle
was placed within the thecal sac, with return of clear CSF. 10 mL
[id number] was injected into the thecal sac, with normal
opacification of the nerve roots and cauda equina consistent with
free flow within the subarachnoid space. The patient was then moved
to the trendelenburg position and contrast flowed into the Thoracic
and Cervical spine regions.

I personally performed the lumbar puncture and administered the
intrathecal contrast. I also personally supervised acquisition of
the myelogram images.
FINDINGS: MYELOGRAM FINDINGS:

LUMBAR: As measured on image 1 series 4, there is 22 degree
scoliosis convex LEFT. There is asymmetric loss of interspace height
at L2-3 on the RIGHT. Rightward translation of L3 and L4. Asymmetric
loss of interspace height at L4-5 on the LEFT is also observed.
There is effacement of the RIGHT L3 nerve root in the lateral recess
at L2-3 on the RIGHT. Lateral view demonstrates no significant
stenosis.

THORACIC: No significant ventral defect or thoracic compression
deformity. Normal-appearing cord size throughout without areas of
stenosis or intraspinal mass lesion.

CT LUMBAR MYELOGRAM FINDINGS:

No prevertebral or paraspinous masses. Moderate atheromatous change
of the aorta is non aneurysmal.

L1-L2:  Unremarkable.

L2-L3: Severe loss of interspace height on the RIGHT with
extraforaminal spurring. Far lateral and foraminal protrusion to the
RIGHT. Moderate vacuum phenomenon. RIGHT L2 and RIGHT L3 nerve root
impingement.

L3-L4: 6 mm leftward translation L3 on L4. Slight disc space
narrowing with mild annular bulging. BILATERAL facet arthropathy. No
neural impingement.

L4-L5: Asymmetric loss of interspace height on the LEFT. Large LEFT
extraforaminal spur. Asymmetric LEFT facet arthropathy. Broad-based
central and leftward protrusion. No L5 nerve root impingement in the
canal. LEFT L4 nerve root displacement is possible in the foramen
and beyond.

L5-S1:  Asymmetric facet arthropathy on the LEFT.

CT THORACIC MYELOGRAM FINDINGS:

No significant disc protrusion or spinal stenosis. Minor annular
bulging is noted at T10-11. The cord is normal. Minor fibrotic
changes in the perihilar and biapical regions of the lung without
visible pulmonary nodule. No worrisome osseous lesions.
IMPRESSION: Severe loss of interspace height at L2-3 on the RIGHT associated
with far lateral and foraminal protrusion and extraforaminal
spurring. RIGHT L2 and RIGHT L3 nerve root impingement are noted.

6 mm leftward translation L3 on L4 without definite stenosis.

Asymmetric loss of interspace height at L4-5 on the LEFT with large
LEFT extraforaminal spur, likely affecting the LEFT L4 nerve root.

No significant thoracic spine compressive pathology.

As measured on AP myelogram film series 4 image 1 there is 22
degrees scoliosis convex LEFT at the L2-3 level.

## 2016-02-21 ENCOUNTER — Ambulatory Visit (INDEPENDENT_AMBULATORY_CARE_PROVIDER_SITE_OTHER): Payer: Medicare Other | Admitting: Sports Medicine

## 2016-02-21 ENCOUNTER — Encounter: Payer: Self-pay | Admitting: Sports Medicine

## 2016-02-21 DIAGNOSIS — Q828 Other specified congenital malformations of skin: Secondary | ICD-10-CM

## 2016-02-21 DIAGNOSIS — E65 Localized adiposity: Secondary | ICD-10-CM

## 2016-02-21 DIAGNOSIS — M79672 Pain in left foot: Secondary | ICD-10-CM

## 2016-02-21 DIAGNOSIS — M2170 Unequal limb length (acquired), unspecified site: Secondary | ICD-10-CM

## 2016-02-21 DIAGNOSIS — M2040 Other hammer toe(s) (acquired), unspecified foot: Secondary | ICD-10-CM

## 2016-02-21 NOTE — Progress Notes (Signed)
Patient ID: Jill Larson, female   DOB: January 25, 1941, 75 y.o.   MRN: [id number]   Subjective: Jill Larson is a 75 y.o. female patient who returns to office for evaluation of Left foot pain secondary to keratotic lesions. Reports that it has started to build back up again, especially since she is wearing flip-flops with the warmer weather.  Patient denies any other pedal complaints.   Patient Active Problem List   Diagnosis Date Noted  . Chronic LBP 03/11/2015  . Clinical depression 03/11/2015  . 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 Pulmonary embolism ([place]) 03/11/2015  . SPL (spondylolisthesis) 03/11/2015  . OTHER DISEASES OF LUNG NOT ELSEWHERE CLASSIFIED 10/30/2009  . GASTRIC POLYP 06/19/2008  . PALPITATIONS 06/14/2008  . HYPERCHOLESTEROLEMIA 04/18/2008  . ANXIETY 04/18/2008  . HYPERTENSION, BORDERLINE 04/18/2008  . CONSTIPATION 04/18/2008  . BACK PAIN, LUMBAR 04/18/2008  . 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 OSTEOPOROSIS 04/18/2008  . MITRAL VALVE PROLAPSE, HX OF 04/18/2008   Current Outpatient Prescriptions on File Prior to Visit  Medication Sig Dispense Refill  . alendronate (FOSAMAX) 70 MG tablet     . ALPRAZolam (XANAX) 0.5 MG tablet TAKE ONE-HALF TO ONE TABLET BY MOUTH THREE TIMES DAILY AS NEEDED FOR NERVES 100 tablet 1  . b complex vitamins capsule Take 1 capsule by mouth daily.    . Cholecalciferol (VITAMIN D PO) Take 1 capsule by mouth daily.    Marland Kitchen escitalopram (LEXAPRO) 10 MG tablet Take 1 tablet by mouth daily.    . Omega-3 Fatty Acids (FISH OIL PO) Take 1 capsule by mouth daily.     No current facility-administered medications on file prior to visit.   No Known Allergies   Objective:  General: Alert and oriented x3 in no acute distress  Dermatology: Keratotic lesion present sub met 2> 5th toe lateral aspect on left with skin lines transversing the lesions, pain is present with direct pressure to the lesion, central nucleated core noted  suggestive of porokeratosis, no webspace macerations, no ecchymosis bilateral, all nails x 10 are well manicured.  Vascular: Dorsalis Pedis 1/4 and Posterior Tibial pedal pulses 2/4, Capillary Fill Time 3 seconds, + pedal hair growth bilateral, no edema bilateral lower extremities, Temperature gradient within normal limits.  Neurology: Johney Maine sensation intact via light touch bilateral.  Musculoskeletal: Mild tenderness with palpation at the lesion site sub met 2 on Left and at the lateral aspect of the fifth toe adjacent to the nail on left, Muscular strength 5/5 in all groups without pain or limitation on range of motion. + Lesser hammertoe bilateral with distal fat pad migration and limb length inequality with the left leg being shorter than the right.  Assessment and Plan: Problem List Items Addressed This Visit    None    Visit Diagnoses    Left foot pain    -  Primary    Porokeratosis        Lower limb length difference        Fat pad syndrome        Hammer toe, unspecified laterality          -Complete examination performed -Discussed treatment options for Porokeratosis and limb inequality -Patient declined complete excision of lesions -Parred keratosis sub met 2 and fifth toe on left using a chisel blade; treated the areas with Salinocaine covered with band-aid to keep intact 1 day -Recommend continue with daily skin emollients and use a pumice stone as needed -Recommend continue with good supportive  shoes and inserts -Recommend continue with heel lift for left shorter lower limb -Patient to return to office as needed or sooner if condition worsens.  Landis Martins, DPM

## 2016-04-11 ENCOUNTER — Ambulatory Visit: Payer: Medicare Other | Admitting: Sports Medicine

## 2016-05-01 ENCOUNTER — Ambulatory Visit: Payer: Medicare Other | Admitting: Sports Medicine

## 2016-05-09 ENCOUNTER — Other Ambulatory Visit: Payer: Self-pay | Admitting: Family

## 2016-05-09 DIAGNOSIS — Z1231 Encounter for screening mammogram for malignant neoplasm of breast: Secondary | ICD-10-CM

## 2016-05-16 ENCOUNTER — Ambulatory Visit: Payer: Medicare Other

## 2016-05-22 ENCOUNTER — Ambulatory Visit
Admission: RE | Admit: 2016-05-22 | Discharge: 2016-05-22 | Disposition: A | Discharge status: Home / Self Care | Payer: Medicare Other | Source: Ambulatory Visit | Attending: Family | Admitting: Family

## 2016-05-22 DIAGNOSIS — Z1231 Encounter for screening mammogram for malignant neoplasm of breast: Secondary | ICD-10-CM

## 2016-05-23 ENCOUNTER — Encounter: Payer: Self-pay | Admitting: Sports Medicine

## 2016-05-23 ENCOUNTER — Encounter (INDEPENDENT_AMBULATORY_CARE_PROVIDER_SITE_OTHER): Payer: Self-pay

## 2016-05-23 ENCOUNTER — Ambulatory Visit (INDEPENDENT_AMBULATORY_CARE_PROVIDER_SITE_OTHER): Payer: Medicare Other | Admitting: Sports Medicine

## 2016-05-23 DIAGNOSIS — E65 Localized adiposity: Secondary | ICD-10-CM

## 2016-05-23 DIAGNOSIS — M79672 Pain in left foot: Secondary | ICD-10-CM

## 2016-05-23 DIAGNOSIS — Q828 Other specified congenital malformations of skin: Secondary | ICD-10-CM | POA: Diagnosis not present

## 2016-05-23 DIAGNOSIS — M2170 Unequal limb length (acquired), unspecified site: Secondary | ICD-10-CM

## 2016-05-23 DIAGNOSIS — M2040 Other hammer toe(s) (acquired), unspecified foot: Secondary | ICD-10-CM

## 2016-05-23 NOTE — Progress Notes (Signed)
Patient ID: Jill Larson, female   DOB: 1941-01-01, 75 y.o.   MRN: [id number]   Subjective: Jill Larson is a 75 y.o. female patient who returns to office for evaluation of Left foot pain secondary to keratotic lesions. Reports that it has started to build back up again. Patient also wants to discuss anything more can be done.  Patient denies any other pedal complaints.   Patient Active Problem List   Diagnosis Date Noted  . Chronic LBP 03/11/2015  . Clinical depression 03/11/2015  . 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 Pulmonary embolism ([facility]) 03/11/2015  . SPL (spondylolisthesis) 03/11/2015  . OTHER DISEASES OF LUNG NOT ELSEWHERE CLASSIFIED 10/30/2009  . GASTRIC POLYP 06/19/2008  . PALPITATIONS 06/14/2008  . HYPERCHOLESTEROLEMIA 04/18/2008  . ANXIETY 04/18/2008  . HYPERTENSION, BORDERLINE 04/18/2008  . CONSTIPATION 04/18/2008  . BACK PAIN, LUMBAR 04/18/2008  . 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 OSTEOPOROSIS 04/18/2008  . MITRAL VALVE PROLAPSE, HX OF 04/18/2008   Current Outpatient Prescriptions on File Prior to Visit  Medication Sig Dispense Refill  . alendronate (FOSAMAX) 70 MG tablet     . ALPRAZolam (XANAX) 0.5 MG tablet TAKE ONE-HALF TO ONE TABLET BY MOUTH THREE TIMES DAILY AS NEEDED FOR NERVES 100 tablet 1  . b complex vitamins capsule Take 1 capsule by mouth daily.    . Cholecalciferol (VITAMIN D PO) Take 1 capsule by mouth daily.    Marland Kitchen escitalopram (LEXAPRO) 10 MG tablet Take 1 tablet by mouth daily.    . Omega-3 Fatty Acids (FISH OIL PO) Take 1 capsule by mouth daily.     No current facility-administered medications on file prior to visit.    No Known Allergies   Objective:  General: Alert and oriented x3 in no acute distress  Dermatology: Keratotic lesion present sub met 2> 5th toe lateral aspect on left with skin lines transversing the lesions, pain is present with direct pressure to the lesion, central nucleated core noted  suggestive of porokeratosis, no webspace macerations, no ecchymosis bilateral, all nails x 10 are well manicured.  Vascular: Dorsalis Pedis 1/4 and Posterior Tibial pedal pulses 2/4, Capillary Fill Time 3 seconds, + pedal hair growth bilateral, no edema bilateral lower extremities, Temperature gradient within normal limits.  Neurology: Michaell Cowing sensation intact via light touch bilateral.  Musculoskeletal: Mild tenderness with palpation at the lesion site sub met 2 on Left and at the lateral aspect of the fifth toe adjacent to the nail on left, Muscular strength 5/5 in all groups without pain or limitation on range of motion. + Lesser hammertoe bilateral with distal fat pad migration and limb length inequality with the left leg being shorter than the right.  Assessment and Plan: Problem List Items Addressed This Visit    None    Visit Diagnoses    Left foot pain    -  Primary   Porokeratosis       Lower limb length difference       Fat pad syndrome       Hammer toe, unspecified laterality         -Complete examination performed -Discussed treatment options for Porokeratosis and limb inequality -Recommend patient to consider excision and fifth hammertoe correction. Patient reports that she will let us know when she is ready for surgical intervention -Parred keratosis sub met 2 and fifth toe on left using a chisel blade; treated the areas with Salinocaine covered with band-aid to keep intact 1 day -Recommend continue with daily  skin emollients and use a pumice stone as needed -Recommend continue with good supportive shoes and inserts -Recommend continue with heel lift for left shorter lower limb -Patient to return to office as needed or sooner if condition worsens.   Landis Martins, DPM

## 2016-07-10 ENCOUNTER — Ambulatory Visit (INDEPENDENT_AMBULATORY_CARE_PROVIDER_SITE_OTHER): Payer: Medicare Other | Admitting: Sports Medicine

## 2016-07-10 ENCOUNTER — Encounter: Payer: Self-pay | Admitting: Sports Medicine

## 2016-07-10 ENCOUNTER — Encounter (INDEPENDENT_AMBULATORY_CARE_PROVIDER_SITE_OTHER): Payer: Self-pay

## 2016-07-10 VITALS — Resp 16 | Ht 61.0 in | Wt 121.0 lb

## 2016-07-10 DIAGNOSIS — M79672 Pain in left foot: Secondary | ICD-10-CM

## 2016-07-10 DIAGNOSIS — M2170 Unequal limb length (acquired), unspecified site: Secondary | ICD-10-CM

## 2016-07-10 DIAGNOSIS — Q828 Other specified congenital malformations of skin: Secondary | ICD-10-CM

## 2016-07-10 DIAGNOSIS — M2040 Other hammer toe(s) (acquired), unspecified foot: Secondary | ICD-10-CM

## 2016-07-10 DIAGNOSIS — E65 Localized adiposity: Secondary | ICD-10-CM

## 2016-07-10 NOTE — Progress Notes (Signed)
Patient ID: Jill Larson, female   DOB: 29-Dec-1940, 75 y.o.   MRN: [id number]   Subjective: Jill Larson is a 75 y.o. female patient who returns to office for evaluation of Left foot pain secondary to keratotic lesions. Reports that it has started to build back up again.  Patient denies any other pedal complaints.   Patient Active Problem List   Diagnosis Date Noted  . Chronic LBP 03/11/2015  . Clinical depression 03/11/2015  . 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 Pulmonary embolism ([place]) 03/11/2015  . SPL (spondylolisthesis) 03/11/2015  . OTHER DISEASES OF LUNG NOT ELSEWHERE CLASSIFIED 10/30/2009  . GASTRIC POLYP 06/19/2008  . PALPITATIONS 06/14/2008  . HYPERCHOLESTEROLEMIA 04/18/2008  . ANXIETY 04/18/2008  . HYPERTENSION, BORDERLINE 04/18/2008  . CONSTIPATION 04/18/2008  . BACK PAIN, LUMBAR 04/18/2008  . 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 OSTEOPOROSIS 04/18/2008  . MITRAL VALVE PROLAPSE, HX OF 04/18/2008   Current Outpatient Prescriptions on File Prior to Visit  Medication Sig Dispense Refill  . alendronate (FOSAMAX) 70 MG tablet     . ALPRAZolam (XANAX) 0.5 MG tablet TAKE ONE-HALF TO ONE TABLET BY MOUTH THREE TIMES DAILY AS NEEDED FOR NERVES 100 tablet 1  . b complex vitamins capsule Take 1 capsule by mouth daily.    . Cholecalciferol (VITAMIN D PO) Take 1 capsule by mouth daily.    Marland Kitchen escitalopram (LEXAPRO) 10 MG tablet Take 1 tablet by mouth daily.    . Omega-3 Fatty Acids (FISH OIL PO) Take 1 capsule by mouth daily.     No current facility-administered medications on file prior to visit.    No Known Allergies   Objective:  General: Alert and oriented x3 in no acute distress  Dermatology: Keratotic lesion present sub met 2> 5th toe lateral aspect on left with skin lines transversing the lesions, pain is present with direct pressure to the lesion, central nucleated core noted suggestive of porokeratosis, no webspace macerations, no  ecchymosis bilateral, all nails x 10 are well manicured.  Vascular: Dorsalis Pedis 1/4 and Posterior Tibial pedal pulses 2/4, Capillary Fill Time 3 seconds, + pedal hair growth bilateral, no edema bilateral lower extremities, Temperature gradient within normal limits.  Neurology: Johney Maine sensation intact via light touch bilateral.  Musculoskeletal: Mild tenderness with palpation at the lesion site sub met 2 on Left and at the lateral aspect of the fifth toe adjacent to the nail on left, Muscular strength 5/5 in all groups without pain or limitation on range of motion. + Lesser hammertoe bilateral with distal fat pad migration and limb length inequality with the left leg being shorter than the right.  Assessment and Plan: Problem List Items Addressed This Visit    None    Visit Diagnoses    Porokeratosis    -  Primary   Left foot pain       Lower limb length difference       Fat pad syndrome       Hammer toe, unspecified laterality         -Complete examination performed -Discussed treatment options for Porokeratosis and limb inequality -Recommend patient to consider excision. Patient reports that she will let us know when she is ready for surgical intervention of which can be done in office for porokeratosis -Parred keratosis sub met 2 and fifth toe on left using a chisel and 15 blade blade; treated the areas with Salinocaine covered with band-aid to keep intact 1 day -Recommend continue with daily skin emollients  and use a pumice stone as needed -Recommend continue with good supportive shoes and inserts; advised patient to bring inserts for me to modify next visit -Recommend continue with heel lift for left shorter lower limb -Patient to return to office as needed or sooner if condition worsens. Advised patient to consider excision of lesions will plan to do it under local anesthesia in office when patient is ready  Jill Larson, DPM

## 2016-10-29 ENCOUNTER — Telehealth: Payer: Self-pay | Admitting: *Deleted

## 2016-10-29 NOTE — Telephone Encounter (Signed)
Pt states she has a place on her foot that Dr. Cannon Kettle has cut on 3-4 times and said if needed again would have to have surgery. Pt states she has an appt tomorrow and no one has called her about her surgery. I asked pt if she had signed paperwork or scheduled a surgery date and she said no. I told her then if the surgery is necessary Dr. Cannon Kettle will discuss tomorrow and maybe schedule. Pt states understanding.

## 2016-10-30 ENCOUNTER — Encounter: Payer: Self-pay | Admitting: Sports Medicine

## 2016-10-30 ENCOUNTER — Ambulatory Visit (INDEPENDENT_AMBULATORY_CARE_PROVIDER_SITE_OTHER): Payer: Medicare Other | Admitting: Sports Medicine

## 2016-10-30 DIAGNOSIS — Q828 Other specified congenital malformations of skin: Secondary | ICD-10-CM

## 2016-10-30 DIAGNOSIS — M79672 Pain in left foot: Secondary | ICD-10-CM | POA: Diagnosis not present

## 2016-10-30 NOTE — Progress Notes (Signed)
Patient ID: LEISA GAULT, female   DOB: 23-Aug-1941, 76 y.o.   MRN: [id number]   Subjective: MARIAVICTORIA NOTTINGHAM is a 76 y.o. female patient who returns to office for evaluation of Left foot pain secondary to keratotic lesions. Reports that it has started to build back up again. Has been using pads that help. States that she should of come in sooner but did not due to weather and holidays.  Patient denies any other pedal complaints.   Patient Active Problem List   Diagnosis Date Noted  . Chronic LBP 03/11/2015  . Clinical depression 03/11/2015  . 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 Pulmonary embolism ([place]) 03/11/2015  . SPL (spondylolisthesis) 03/11/2015  . OTHER DISEASES OF LUNG NOT ELSEWHERE CLASSIFIED 10/30/2009  . GASTRIC POLYP 06/19/2008  . PALPITATIONS 06/14/2008  . HYPERCHOLESTEROLEMIA 04/18/2008  . ANXIETY 04/18/2008  . HYPERTENSION, BORDERLINE 04/18/2008  . CONSTIPATION 04/18/2008  . BACK PAIN, LUMBAR 04/18/2008  . 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 OSTEOPOROSIS 04/18/2008  . MITRAL VALVE PROLAPSE, HX OF 04/18/2008   Current Outpatient Prescriptions on File Prior to Visit  Medication Sig Dispense Refill  . alendronate (FOSAMAX) 70 MG tablet     . ALPRAZolam (XANAX) 0.5 MG tablet TAKE ONE-HALF TO ONE TABLET BY MOUTH THREE TIMES DAILY AS NEEDED FOR NERVES 100 tablet 1  . b complex vitamins capsule Take 1 capsule by mouth daily.    . Cholecalciferol (VITAMIN D PO) Take 1 capsule by mouth daily.    Marland Kitchen escitalopram (LEXAPRO) 10 MG tablet Take 1 tablet by mouth daily.    . Omega-3 Fatty Acids (FISH OIL PO) Take 1 capsule by mouth daily.     No current facility-administered medications on file prior to visit.    No Known Allergies   Objective:  General: Alert and oriented x3 in no acute distress  Dermatology: Keratotic lesion present sub met 2> 5th toe lateral aspect on left with skin lines transversing the lesions, pain is present with direct pressure to  the lesion, central nucleated core noted suggestive of porokeratosis, no webspace macerations, no ecchymosis bilateral, all nails x 10 are well manicured.  Vascular: Dorsalis Pedis 1/4 and Posterior Tibial pedal pulses 2/4, Capillary Fill Time 3 seconds, + pedal hair growth bilateral, no edema bilateral lower extremities, Temperature gradient within normal limits.  Neurology: Johney Maine sensation intact via light touch bilateral.  Musculoskeletal: Mild tenderness with palpation at the lesion site sub met 2 on Left and at the lateral aspect of the fifth toe adjacent to the nail on left, Muscular strength 5/5 in all groups without pain or limitation on range of motion. + Lesser hammertoe bilateral with distal fat pad migration and limb length inequality with the left leg being shorter than the right.  Assessment and Plan: Problem List Items Addressed This Visit    None    Visit Diagnoses    Porokeratosis    -  Primary   Left foot pain         -Complete examination performed -Discussed treatment options for Porokeratosis and limb inequality -Recommend patient to consider excision. -Parred keratosis sub met 2on left using a chisel blade, treated the area with Salinocaine and offloading pad covered with band-aid to keep intact 1 day -Recommend continue with daily skin emollients and use a pumice stone as needed -Recommend continue with good supportive shoes and inserts -Recommend continue with heel lift for left shorter lower limb -Patient to return to office as needed or sooner if condition  worsens.   Landis Martins, DPM

## 2016-12-20 ENCOUNTER — Encounter: Payer: Self-pay | Admitting: Sports Medicine

## 2016-12-20 ENCOUNTER — Ambulatory Visit (INDEPENDENT_AMBULATORY_CARE_PROVIDER_SITE_OTHER): Payer: Medicare Other | Admitting: Sports Medicine

## 2016-12-20 DIAGNOSIS — M79672 Pain in left foot: Secondary | ICD-10-CM

## 2016-12-20 DIAGNOSIS — M2170 Unequal limb length (acquired), unspecified site: Secondary | ICD-10-CM

## 2016-12-20 DIAGNOSIS — Q828 Other specified congenital malformations of skin: Secondary | ICD-10-CM | POA: Diagnosis not present

## 2016-12-20 DIAGNOSIS — M2040 Other hammer toe(s) (acquired), unspecified foot: Secondary | ICD-10-CM | POA: Diagnosis not present

## 2016-12-20 DIAGNOSIS — E65 Localized adiposity: Secondary | ICD-10-CM | POA: Diagnosis not present

## 2016-12-20 NOTE — Progress Notes (Signed)
Patient ID: Jill Larson, female   DOB: January 21, 1941, 76 y.o.   MRN: [id number]   Subjective: Jill Larson is a 76 y.o. female patient who returns to office for evaluation of Left foot pain secondary to keratotic lesions. Has been using pads that help but states feel like its getting worse not much relief after last time. Patient also desires to discuss treatment for left 2nd thick toenail that is splitting.  Patient denies any other pedal complaints.   Patient Active Problem List   Diagnosis Date Noted  . Chronic LBP 03/11/2015  . Clinical depression 03/11/2015  . 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 Pulmonary embolism ([place]) 03/11/2015  . SPL (spondylolisthesis) 03/11/2015  . OTHER DISEASES OF LUNG NOT ELSEWHERE CLASSIFIED 10/30/2009  . GASTRIC POLYP 06/19/2008  . PALPITATIONS 06/14/2008  . HYPERCHOLESTEROLEMIA 04/18/2008  . ANXIETY 04/18/2008  . HYPERTENSION, BORDERLINE 04/18/2008  . CONSTIPATION 04/18/2008  . BACK PAIN, LUMBAR 04/18/2008  . 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 OSTEOPOROSIS 04/18/2008  . MITRAL VALVE PROLAPSE, HX OF 04/18/2008   Current Outpatient Prescriptions on File Prior to Visit  Medication Sig Dispense Refill  . alendronate (FOSAMAX) 70 MG tablet     . ALPRAZolam (XANAX) 0.5 MG tablet TAKE ONE-HALF TO ONE TABLET BY MOUTH THREE TIMES DAILY AS NEEDED FOR NERVES 100 tablet 1  . b complex vitamins capsule Take 1 capsule by mouth daily.    . Cholecalciferol (VITAMIN D PO) Take 1 capsule by mouth daily.    . Omega-3 Fatty Acids (FISH OIL PO) Take 1 capsule by mouth daily.     No current facility-administered medications on file prior to visit.    No Known Allergies  Social History   Social History  . Marital status: Married    Spouse name: N/A  . Number of children: 2  . Years of education: N/A   Occupational History  . Retired Licensed conveyancer    Social History Main Topics  . Smoking status: Former Smoker    Types: Cigarettes  .  Smokeless tobacco: Never Used  . Alcohol use Yes     Comment: Social  . Drug use: No  . Sexual activity: Not on file   Other Topics Concern  . Not on file   Social History Narrative  . No narrative on file    Family History  Problem Relation Age of Onset  . Hypertension Father   . Pancreatic cancer Sister   . Colon cancer Neg Hx     Past Surgical History:  Procedure Laterality Date  . BREAST SURGERY     right to remove calcium deposits  . TONSILLECTOMY AND ADENOIDECTOMY    . TUBAL LIGATION      Objective:  General: Alert and oriented x3 in no acute distress  Dermatology: Keratotic lesion present sub met 2 on left with skin lines transversing the lesion, pain is present with direct pressure to the lesion, central nucleated core noted suggestive of porokeratosis, no webspace macerations, no ecchymosis bilateral, all nails x 10 are well manicured. However, there is mild thickening of left second toenail.  Vascular: Dorsalis Pedis 1/4 and Posterior Tibial pedal pulses 2/4, Capillary Fill Time 3 seconds, + pedal hair growth bilateral, no edema bilateral lower extremities, Temperature gradient within normal limits.  Neurology: Gross sensation intact via light touch bilateral.  Musculoskeletal: Mild tenderness with palpation at the lesion site sub met 2 on Left. Muscular strength 5/5 in all groups without pain or limitation on range of motion. +  Lesser hammertoe bilateral with distal fat pad migration and limb length inequality with the left leg being shorter than the right.  Assessment and Plan: Problem List Items Addressed This Visit    None    Visit Diagnoses    Porokeratosis    -  Primary   Left foot pain       Lower limb length difference       Fat pad syndrome       Hammer toe, unspecified laterality         -Complete examination performed -Discussed treatment options for Porokeratosis and limb inequality -Recommend patient to consider excision. -Patient opt for  surgical management. Consent obtained for excision of painful lesion, plantar surfaces of left foot Pre and Post op course explained. Risks, benefits, alternatives explained. No guarantees given or implied. Surgical booking slip submitted and provided patient with Surgical packet and info for [facility] surgical center -Dispensed offloading surgical shoe to use post op -Meanwhile, Parred keratosis sub met 2 on left using a chisel blade, treated the area with Salinocaine and offloading pad covered with band-aid to keep intact 1 day -Recommend continue with daily skin emollients and use a pumice stone until time of surgery -Recommend continue with good supportive shoes and inserts until time of surgery -Recommend continue with heel lift for left shorter lower limb -Recommend to file left second toenail daily, and apply tea tree oil -Patient to return to office after surgery or sooner if condition worsens.   Landis Martins, DPM

## 2016-12-20 NOTE — Patient Instructions (Signed)
Pre-Operative Instructions  Congratulations, you have decided to take an important step to improving your quality of life.  You can be assured that the doctors of [facility] will be with you every step of the way.  1. Plan to be at the surgery center/hospital at least 1 (one) hour prior to your scheduled time unless otherwise directed by the surgical center/hospital staff.  You must have a responsible adult accompany you, remain during the surgery and drive you home.  Make sure you have directions to the surgical center/hospital and know how to get there on time. 2. For hospital based surgery you will need to obtain a history and physical form from your family physician within 1 month prior to the date of surgery- we will give you a form for you primary physician.  3. We make every effort to accommodate the date you request for surgery.  There are however, times where surgery dates or times have to be moved.  We will contact you as soon as possible if a change in schedule is required.   4. No Aspirin/Ibuprofen for one week before surgery.  If you are on aspirin, any non-steroidal anti-inflammatory medications (Mobic, Aleve, Ibuprofen) you should stop taking it 7 days prior to your surgery.  You make take Tylenol  For pain prior to surgery.  5. Medications- If you are taking daily heart and blood pressure medications, seizure, reflux, allergy, asthma, anxiety, pain or diabetes medications, make sure the surgery center/hospital is aware before the day of surgery so they may notify you which medications to take or avoid the day of surgery. 6. No food or drink after midnight the night before surgery unless directed otherwise by surgical center/hospital staff. 7. No alcoholic beverages 24 hours prior to surgery.  No smoking 24 hours prior to or 24 hours after surgery. 8. Wear loose pants or shorts- loose enough to fit over bandages, boots, and casts. 9. No slip on shoes, sneakers are best. 10. Bring  your boot with you to the surgery center/hospital.  Also bring crutches or a walker if your physician has prescribed it for you.  If you do not have this equipment, it will be provided for you after surgery. 11. If you have not been contracted by the surgery center/hospital by the day before your surgery, call to confirm the date and time of your surgery. 12. Leave-time from work may vary depending on the type of surgery you have.  Appropriate arrangements should be made prior to surgery with your employer. 13. Prescriptions will be provided immediately following surgery by your doctor.  Have these filled as soon as possible after surgery and take the medication as directed. 14. Remove nail polish on the operative foot. 15. Wash the night before surgery.  The night before surgery wash the foot and leg well with the antibacterial soap provided and water paying special attention to beneath the toenails and in between the toes.  Rinse thoroughly with water and dry well with a towel.  Perform this wash unless told not to do so by your physician.  Enclosed: 1 Ice pack (please put in freezer the night before surgery)   1 Hibiclens skin cleaner   Pre-op Instructions  If you have any questions regarding the instructions, do not hesitate to call our office.  [place]: [address] [place], [place] [address] [phone]  [place]: [address] [place], [place] [address] [phone]  [place]: [address]  [place], [place] [address] [phone]   Dr.   Norman Regal DPM, Dr. Matthew Wagoner DPM, Dr. M. Todd Hyatt DPM, Dr. Olaf Mesa DPM 

## 2017-03-10 ENCOUNTER — Telehealth: Payer: Self-pay | Admitting: *Deleted

## 2017-03-10 NOTE — Telephone Encounter (Signed)
"  I been seeing the black doctor there.  She's been cutting a place off my foot.  It just keeps coming back.  She said if it kept coming back I'd have to do surgery.  Please give me a call."

## 2017-03-11 NOTE — Telephone Encounter (Signed)
I'm returning your call from yesterday.  "I didn't call you."  You left me a message about scheduling surgery.  "Oh, I made an appointment to see her.  I guess we'll make a decision then."

## 2017-03-19 ENCOUNTER — Ambulatory Visit: Payer: Medicare Other | Admitting: Sports Medicine

## 2017-03-19 DIAGNOSIS — E65 Localized adiposity: Secondary | ICD-10-CM

## 2017-03-19 DIAGNOSIS — M79672 Pain in left foot: Secondary | ICD-10-CM | POA: Diagnosis not present

## 2017-03-19 DIAGNOSIS — M216X2 Other acquired deformities of left foot: Secondary | ICD-10-CM | POA: Diagnosis not present

## 2017-03-19 DIAGNOSIS — Q828 Other specified congenital malformations of skin: Secondary | ICD-10-CM | POA: Diagnosis not present

## 2017-03-19 NOTE — Patient Instructions (Signed)
Pre-Operative Instructions  Congratulations, you have decided to take an important step to improving your quality of life.  You can be assured that the doctors of [facility] will be with you every step of the way.  1. Plan to be at the surgery center/hospital at least 1 (one) hour prior to your scheduled time unless otherwise directed by the surgical center/hospital staff.  You must have a responsible adult accompany you, remain during the surgery and drive you home.  Make sure you have directions to the surgical center/hospital and know how to get there on time. 2. For hospital based surgery you will need to obtain a history and physical form from your family physician within 1 month prior to the date of surgery- we will give you a form for you primary physician.  3. We make every effort to accommodate the date you request for surgery.  There are however, times where surgery dates or times have to be moved.  We will contact you as soon as possible if a change in schedule is required.   4. No Aspirin/Ibuprofen for one week before surgery.  If you are on aspirin, any non-steroidal anti-inflammatory medications (Mobic, Aleve, Ibuprofen) you should stop taking it 7 days prior to your surgery.  You make take Tylenol  For pain prior to surgery.  5. Medications- If you are taking daily heart and blood pressure medications, seizure, reflux, allergy, asthma, anxiety, pain or diabetes medications, make sure the surgery center/hospital is aware before the day of surgery so they may notify you which medications to take or avoid the day of surgery. 6. No food or drink after midnight the night before surgery unless directed otherwise by surgical center/hospital staff. 7. No alcoholic beverages 24 hours prior to surgery.  No smoking 24 hours prior to or 24 hours after surgery. 8. Wear loose pants or shorts- loose enough to fit over bandages, boots, and casts. 9. No slip on shoes, sneakers are best. 10. Bring  your boot with you to the surgery center/hospital.  Also bring crutches or a walker if your physician has prescribed it for you.  If you do not have this equipment, it will be provided for you after surgery. 11. If you have not been contracted by the surgery center/hospital by the day before your surgery, call to confirm the date and time of your surgery. 12. Leave-time from work may vary depending on the type of surgery you have.  Appropriate arrangements should be made prior to surgery with your employer. 13. Prescriptions will be provided immediately following surgery by your doctor.  Have these filled as soon as possible after surgery and take the medication as directed. 14. Remove nail polish on the operative foot. 15. Wash the night before surgery.  The night before surgery wash the foot and leg well with the antibacterial soap provided and water paying special attention to beneath the toenails and in between the toes.  Rinse thoroughly with water and dry well with a towel.  Perform this wash unless told not to do so by your physician.  Enclosed: 1 Ice pack (please put in freezer the night before surgery)   1 Hibiclens skin cleaner   Pre-op Instructions  If you have any questions regarding the instructions, do not hesitate to call our office.  [place]: [address] [place], [place] [address] [phone]  [place]: [address] [place], [place] [address] [phone]  [place]: [address]  [place], [place] [address] [phone]   Dr.   Norman Regal DPM, Dr. Matthew Wagoner DPM, Dr. M. Todd Hyatt DPM, Dr. Joron Velis DPM 

## 2017-03-19 NOTE — Progress Notes (Signed)
Patient ID: Jill Larson, female   DOB: [birth year], 76 y.o.   MRN: [id number]   Subjective: Jill Larson is a 76 y.o. female patient who returns to office for evaluation of Left foot pain secondary to keratotic lesions. Has been using pads that help but states that it keeps coming back and wants something done.  Patient denies any other pedal complaints.   Patient Active Problem List   Diagnosis Date Noted  . Chronic LBP 03/11/2015  . Clinical depression 03/11/2015  . Intestinal obstruction due to decreased peristalsis ([place]) 03/11/2015  . Pulmonary embolism ([place]) 03/11/2015  . SPL (spondylolisthesis) 03/11/2015  . OTHER DISEASES OF LUNG NOT ELSEWHERE CLASSIFIED 10/30/2009  . GASTRIC POLYP 06/19/2008  . PALPITATIONS 06/14/2008  . HYPERCHOLESTEROLEMIA 04/18/2008  . ANXIETY 04/18/2008  . HYPERTENSION, BORDERLINE 04/18/2008  . CONSTIPATION 04/18/2008  . BACK PAIN, LUMBAR 04/18/2008  . 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 OSTEOPOROSIS 04/18/2008  . MITRAL VALVE PROLAPSE, HX OF 04/18/2008   Current Outpatient Prescriptions on File Prior to Visit  Medication Sig Dispense Refill  . alendronate (FOSAMAX) 70 MG tablet     . ALPRAZolam (XANAX) 0.5 MG tablet TAKE ONE-HALF TO ONE TABLET BY MOUTH THREE TIMES DAILY AS NEEDED FOR NERVES 100 tablet 1  . ALPRAZolam (XANAX) 1 MG tablet     . b complex vitamins capsule Take 1 capsule by mouth daily.    Marland Kitchen buPROPion (WELLBUTRIN) 75 MG tablet     . Cholecalciferol (VITAMIN D PO) Take 1 capsule by mouth daily.    . citalopram (CELEXA) 20 MG tablet Take 20 mg by mouth.    . cyclobenzaprine (FLEXERIL) 10 MG tablet Take 10 mg by mouth.    . escitalopram (LEXAPRO) 20 MG tablet     . Omega-3 Fatty Acids (FISH OIL PO) Take 1 capsule by mouth daily.    Marland Kitchen oxyCODONE-acetaminophen (PERCOCET/ROXICET) 5-325 MG tablet Take by mouth.     No current facility-administered medications on file prior to visit.    No Known Allergies  Social History   Social History   . Marital status: Married    Spouse name: N/A  . Number of children: 2  . Years of education: N/A   Occupational History  . Retired Licensed conveyancer    Social History Main Topics  . Smoking status: Former Smoker    Types: Cigarettes  . Smokeless tobacco: Never Used  . Alcohol use Yes     Comment: Social  . Drug use: No  . Sexual activity: Not on file   Other Topics Concern  . Not on file   Social History Narrative  . No narrative on file    Family History  Problem Relation Age of Onset  . Hypertension Father   . Pancreatic cancer Sister   . Colon cancer Neg Hx     Past Surgical History:  Procedure Laterality Date  . BREAST SURGERY     right to remove calcium deposits  . TONSILLECTOMY AND ADENOIDECTOMY    . TUBAL LIGATION      Objective:  General: Alert and oriented x3 in no acute distress  Dermatology: Keratotic lesion present sub met 2 on left with skin lines transversing the lesion, pain is present with direct pressure to the lesion, central nucleated core noted suggestive of porokeratosis, no webspace macerations, no ecchymosis bilateral, all nails x 10 are well manicured. However, there is mild thickening of left second toenail.  Vascular: Dorsalis Pedis 1/4 and Posterior Tibial pedal pulses 2/4, Capillary Fill Time  3 seconds, + pedal hair growth bilateral, no edema bilateral lower extremities, Temperature gradient within normal limits.  Neurology: Johney Maine sensation intact via light touch bilateral.  Musculoskeletal: Mild tenderness with palpation at the lesion site sub met 2 on Left with mildly prominent metatarsal head. Muscular strength 5/5 in all groups without pain or limitation on range of motion. + Lesser hammertoe bilateral with distal fat pad migration and limb length inequality with the left leg being shorter than the right.  Assessment and Plan: Problem List Items Addressed This Visit    None    Visit Diagnoses    Porokeratosis    -  Primary   Prominent  metatarsal head of left foot       Left foot pain       Fat pad syndrome         -Complete examination performed -Discussed treatment options for Porokeratosis and limb inequality -Recommend patient to consider excision again, as previously discussed 2 months ago. -Patient opt for surgical management. Consent obtained for excision of painful lesion, plantar surface of left foot with shaving down of any prominent bone. Pre and Post op course explained. Risks, benefits, alternatives explained. No guarantees given or implied. Surgical booking slip submitted and provided patient with Surgical packet and info for [facility] surgical center -Patient already has at home offloading surgical shoe to use post op -Meanwhile, Parred keratosis sub met 2 on left using a chisel blade, treated the area with Salinocaine and offloading pad covered with band-aid to keep intact 1 day -Recommend continue with daily skin emollients and use a pumice stone until time of surgery -Recommend continue with good supportive shoes and inserts until time of surgery -Recommend continue with heel lift for left shorter lower limb -Recommend to continue to file left second toenail daily, and apply tea tree oil -Patient to return to office after surgery or sooner if condition worsens.   Landis Martins, DPM

## 2017-04-14 ENCOUNTER — Telehealth: Payer: Self-pay | Admitting: *Deleted

## 2017-04-14 NOTE — Telephone Encounter (Signed)
"  I'm a patient of Dr. Cannon Kettle.  She's going to do surgery on me.  I need to set up a date."

## 2017-04-16 NOTE — Telephone Encounter (Signed)
"  I need to schedule my surgery with Dr. Cannon Kettle in [place].  I have already had my physical about two weeks ago."  So, you need to have surgery either 07/02 or 07/09.  Let me call and see what they have available and give you a call back.  I called and they said they can schedule you for Monday, July 2.  Is that day okay for you?  "My husband has an eye appointment for that day but I will call and reschedule it.  Go ahead and put me down for that date."  Someone from the surgical center will call you with the arrival time a day or two before surgery date.

## 2017-04-21 ENCOUNTER — Telehealth: Payer: Self-pay | Admitting: Sports Medicine

## 2017-04-21 NOTE — Telephone Encounter (Signed)
I called Blanch Media back at [facility] and told her pt's PCP is not in the office today to sign H&P and that I also called and told the pt she would not be able to have her surgery today since they do not have the signed H&P. Blanch Media said thank you very much.

## 2017-04-21 NOTE — Telephone Encounter (Signed)
Called pt letting her know that [facility] does not have a signed H&P for pt's surgery today. Called PCP office and Dr. Allean Found is not in the office today to sign office visit note from 12 June. I call the pt and told her without the signed H&P her surgery would be rescheduled and/or cancelled. Pt was not aware she needed to have a signed H&P.

## 2017-04-22 ENCOUNTER — Telehealth: Payer: Self-pay | Admitting: *Deleted

## 2017-04-22 NOTE — Telephone Encounter (Signed)
We are going to hopefully reschedule surgery to 04/28/2017 if it's available.

## 2017-04-22 NOTE — Telephone Encounter (Signed)
I am calling to see if you would like for me to get you scheduled for Monday of next week for your surgery.  "Yes, I don't think I have anything going on that day."  You need to call your doctor's office and make sure they fill out the history and physical form.  Surgery cannot be performed without completion of the history and physical form.  "I will call them today and see if I can get her to fill that out.  I don't know why she didn't fill it out when I gave it to her." ' I called and spoke to Ms. Blanch Media, the scheduler, at [facility] to see if we can do patient's surgery on July 9.  She's going to check her schedule and give me a call back.

## 2017-04-22 NOTE — Telephone Encounter (Signed)
"  I'm returning your call.  I can do it in [place].  We live in between [place].  Will I need to get my physical sent to them?"  It is not needed for this facility.  "I wasted time getting a physical.  Is there anything I need to do?"  You will need to go by the [place] office to pick up a brochure for [facility].  Some instructions are enclosed on how to register with the surgical center.  Our [place] office will be open on Thursday.  Someone from the surgical center will call you with the arrival time on Friday.  "Okay, thank you so much."

## 2017-04-22 NOTE — Telephone Encounter (Signed)
Thanks also make sure her post op appointment are adjusted as well. -Dr. Cannon Kettle

## 2017-04-22 NOTE — Telephone Encounter (Signed)
"  We are not going to be able to do her surgery on July 9.  We do not have any released block time for that day, the schedule is full.  We can do her on the 10th."  Dr. Cannon Kettle will be on vacation.    I called and left patient a message that we are not going to be able to perform her surgery on July 9.  I informed her that we can do her surgery at [facility] on July 9.  I asked her to give me a call back if she wanted to go that route.

## 2017-04-25 ENCOUNTER — Telehealth: Payer: Self-pay | Admitting: *Deleted

## 2017-04-25 NOTE — Telephone Encounter (Signed)
"  I am calling to cancel my surgery.  I just want to hold off for now.  I have a lot going on right now.  I'll call later to reschedule."  I will let Dr. Cannon Kettle know and cancel surgery at the surgical center.  I called [facility] and canceled her surgery.  I spoke to Judson Roch.  She said she would cancel the surgery.

## 2017-04-29 ENCOUNTER — Ambulatory Visit: Payer: Medicare Other

## 2017-05-07 ENCOUNTER — Encounter: Payer: Medicare Other | Admitting: Sports Medicine

## 2017-05-14 ENCOUNTER — Encounter: Payer: Medicare Other | Admitting: Sports Medicine

## 2017-10-23 ENCOUNTER — Telehealth: Payer: Self-pay | Admitting: *Deleted

## 2017-10-23 ENCOUNTER — Telehealth: Payer: Self-pay | Admitting: Sports Medicine

## 2017-10-23 NOTE — Telephone Encounter (Addendum)
I reviewed Chart Review telephone message and A. Horton had left message today for pt to schedule for consultation with Dr. Cannon Kettle. Transferred pt to A. Horton - Scheduler.

## 2017-10-23 NOTE — Telephone Encounter (Signed)
Left vm for pt to call to schedule surgical consultation with Dr. Cannon Kettle in [place].

## 2017-10-23 NOTE — Telephone Encounter (Signed)
Pt states she received a call today she thinks it was from [facility].

## 2017-11-07 ENCOUNTER — Ambulatory Visit (INDEPENDENT_AMBULATORY_CARE_PROVIDER_SITE_OTHER): Payer: Medicare Other | Admitting: Sports Medicine

## 2017-11-07 ENCOUNTER — Encounter: Payer: Self-pay | Admitting: Sports Medicine

## 2017-11-07 ENCOUNTER — Ambulatory Visit (INDEPENDENT_AMBULATORY_CARE_PROVIDER_SITE_OTHER): Payer: Medicare Other

## 2017-11-07 DIAGNOSIS — M216X2 Other acquired deformities of left foot: Secondary | ICD-10-CM

## 2017-11-07 DIAGNOSIS — Q828 Other specified congenital malformations of skin: Secondary | ICD-10-CM | POA: Diagnosis not present

## 2017-11-07 DIAGNOSIS — M79672 Pain in left foot: Secondary | ICD-10-CM | POA: Diagnosis not present

## 2017-11-07 DIAGNOSIS — E65 Localized adiposity: Secondary | ICD-10-CM

## 2017-11-07 NOTE — Progress Notes (Signed)
Patient ID: Jill Larson, female   DOB: 02-20-1941, 77 y.o.   MRN: [id number]   Subjective: Jill Larson is a 77 y.o. female patient who returns to office for evaluation of Left foot pain secondary to keratotic lesion. Has been using pads that help but states that it keeps coming back and is getting more painful and more unbearable each time.  Patient states that finally she wants something done.  Patient denies any other pedal complaints.   Review of Systems  Musculoskeletal:       Foot pain  All other systems reviewed and are negative.    Patient Active Problem List   Diagnosis Date Noted  . Chronic LBP 03/11/2015  . Clinical depression 03/11/2015  . Intestinal obstruction due to decreased peristalsis ([place]) 03/11/2015  . Pulmonary embolism ([place]) 03/11/2015  . SPL (spondylolisthesis) 03/11/2015  . OTHER DISEASES OF LUNG NOT ELSEWHERE CLASSIFIED 10/30/2009  . GASTRIC POLYP 06/19/2008  . PALPITATIONS 06/14/2008  . HYPERCHOLESTEROLEMIA 04/18/2008  . ANXIETY 04/18/2008  . HYPERTENSION, BORDERLINE 04/18/2008  . CONSTIPATION 04/18/2008  . BACK PAIN, LUMBAR 04/18/2008  . 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 OSTEOPOROSIS 04/18/2008  . MITRAL VALVE PROLAPSE, HX OF 04/18/2008   Current Outpatient Medications on File Prior to Visit  Medication Sig Dispense Refill  . alendronate (FOSAMAX) 70 MG tablet     . ALPRAZolam (XANAX) 0.5 MG tablet TAKE ONE-HALF TO ONE TABLET BY MOUTH THREE TIMES DAILY AS NEEDED FOR NERVES 100 tablet 1  . ALPRAZolam (XANAX) 1 MG tablet     . b complex vitamins capsule Take 1 capsule by mouth daily.    Marland Kitchen buPROPion (WELLBUTRIN) 75 MG tablet     . Cholecalciferol (VITAMIN D PO) Take 1 capsule by mouth daily.    . citalopram (CELEXA) 20 MG tablet Take 20 mg by mouth.    . cyclobenzaprine (FLEXERIL) 10 MG tablet Take 10 mg by mouth.    . escitalopram (LEXAPRO) 20 MG tablet     . Omega-3 Fatty Acids (FISH OIL PO) Take 1 capsule by mouth daily.    Marland Kitchen  oxyCODONE-acetaminophen (PERCOCET/ROXICET) 5-325 MG tablet Take by mouth.     No current facility-administered medications on file prior to visit.    No Known Allergies  Social History   Socioeconomic History  . Marital status: Married    Spouse name: Not on file  . Number of children: 2  . Years of education: Not on file  . Highest education level: Not on file  Social Needs  . Financial resource strain: Not on file  . Food insecurity - worry: Not on file  . Food insecurity - inability: Not on file  . Transportation needs - medical: Not on file  . Transportation needs - non-medical: Not on file  Occupational History  . Occupation: Retired Licensed conveyancer  Tobacco Use  . Smoking status: Former Smoker    Types: Cigarettes  . Smokeless tobacco: Never Used  Substance and Sexual Activity  . Alcohol use: Yes    Comment: Social  . Drug use: No  . Sexual activity: Not on file  Other Topics Concern  . Not on file  Social History Narrative  . Not on file    Family History  Problem Relation Age of Onset  . Hypertension Father   . Pancreatic cancer Sister   . Colon cancer Neg Hx     Past Surgical History:  Procedure Laterality Date  . BREAST SURGERY     right to remove calcium deposits  .  TONSILLECTOMY AND ADENOIDECTOMY    . TUBAL LIGATION      Objective:  General: Alert and oriented x3 in no acute distress  Dermatology: Keratotic lesion present sub met 2 on left with skin lines transversing the lesion, pain is present with direct pressure to the lesion, central nucleated core noted suggestive of porokeratosis, no webspace macerations, no ecchymosis bilateral, all nails x 10 are well manicured. However, there is mild thickening of left second toenail.  Vascular: Dorsalis Pedis 1/4 and Posterior Tibial pedal pulses 2/4, Capillary Fill Time 3 seconds, + pedal hair growth bilateral, no edema bilateral lower extremities, Temperature gradient within normal limits.  Neurology:  Johney Maine sensation intact via light touch bilateral.  Musculoskeletal: Mild tenderness with palpation at the lesion site sub met 2 on Left with mildly prominent metatarsal head. Muscular strength 5/5 in all groups without pain or limitation on range of motion. + Lesser hammertoe bilateral with distal fat pad migration and limb length inequality with the left leg being shorter than the right.  Assessment and Plan: Problem List Items Addressed This Visit    None    Visit Diagnoses    Prominent metatarsal head of left foot    -  Primary   Relevant Orders   DG Foot Complete Left   Left foot pain       Relevant Orders   DG Foot Complete Left   Porokeratosis       Fat pad syndrome         -Complete examination performed -Discussed treatment options for Porokeratosis and limb inequality -Recommend patient to consider excision again, as previously discussed 2 months ago. -Patient opt for surgical management. Consent obtained for excision of painful lesion, plantar surface of left foot with shaving down of any prominent bone. Pre and Post op course explained. Risks, benefits, alternatives explained. No guarantees given or implied. Surgical booking slip submitted and provided patient with Surgical packet and info for [facility] surgical center -Patient already has at home offloading surgical shoe to use post op -Meanwhile, Parred keratosis sub met 2 on left using a chisel blade, treated the area with Salinocaine and offloading pad covered with band-aid to keep intact 1 day -Recommend continue with daily skin emollients and use a pumice stone until time of surgery -Recommend continue with good supportive shoes and inserts until time of surgery -Recommend continue with heel lift for left shorter lower limb until time for surgery -Patient to return to office after surgery or sooner if condition worsens.   Jill Larson, DPM

## 2017-11-07 NOTE — Patient Instructions (Signed)
Pre-Operative Instructions  Congratulations, you have decided to take an important step towards improving your quality of life.  You can be assured that the doctors and staff at [facility] will be with you every step of the way.  Here are some important things you should know:  1. Plan to be at the surgery center/hospital at least 1 (one) hour prior to your scheduled time, unless otherwise directed by the surgical center/hospital staff.  You must have a responsible adult accompany you, remain during the surgery and drive you home.  Make sure you have directions to the surgical center/hospital to ensure you arrive on time. 2. If you are having surgery at [facility] or [facility] hospitals, you will need a copy of your medical history and physical form from your family physician within one month prior to the date of surgery. We will give you a form for your primary physician to complete.  3. We make every effort to accommodate the date you request for surgery.  However, there are times where surgery dates or times have to be moved.  We will contact you as soon as possible if a change in schedule is required.   4. No aspirin/ibuprofen for one week before surgery.  If you are on aspirin, any non-steroidal anti-inflammatory medications (Mobic, Aleve, Ibuprofen) should not be taken seven (7) days prior to your surgery.  You make take Tylenol for pain prior to surgery.  5. Medications - If you are taking daily heart and blood pressure medications, seizure, reflux, allergy, asthma, anxiety, pain or diabetes medications, make sure you notify the surgery center/hospital before the day of surgery so they can tell you which medications you should take or avoid the day of surgery. 6. No food or drink after midnight the night before surgery unless directed otherwise by surgical center/hospital staff. 7. No alcoholic beverages 24-hours prior to surgery.  No smoking 24-hours prior or 24-hours after  surgery. 8. Wear loose pants or shorts. They should be loose enough to fit over bandages, boots, and casts. 9. Don't wear slip-on shoes. Sneakers are preferred. 10. Bring your boot with you to the surgery center/hospital.  Also bring crutches or a walker if your physician has prescribed it for you.  If you do not have this equipment, it will be provided for you after surgery. 11. If you have not been contacted by the surgery center/hospital by the day before your surgery, call to confirm the date and time of your surgery. 12. Leave-time from work may vary depending on the type of surgery you have.  Appropriate arrangements should be made prior to surgery with your employer. 13. Prescriptions will be provided immediately following surgery by your doctor.  Fill these as soon as possible after surgery and take the medication as directed. Pain medications will not be refilled on weekends and must be approved by the doctor. 14. Remove nail polish on the operative foot and avoid getting pedicures prior to surgery. 15. Wash the night before surgery.  The night before surgery wash the foot and leg well with water and the antibacterial soap provided. Be sure to pay special attention to beneath the toenails and in between the toes.  Wash for at least three (3) minutes. Rinse thoroughly with water and dry well with a towel.  Perform this wash unless told not to do so by your physician.  Enclosed: 1 Ice pack (please put in freezer the night before surgery)   1 Hibiclens skin cleaner     Pre-op instructions  If you have any questions regarding the instructions, please do not hesitate to call our office.  [place]: [address], [place], [place] [address] -- [phone]  [place]: [address] [place], [place] [address] -- [phone]  [place]: [address]  ,  [address] -- [phone]  [place]: [address], [address], [place],  [address] -- [phone]  Website:  [email or web address] 

## 2017-12-01 ENCOUNTER — Telehealth: Payer: Self-pay | Admitting: Sports Medicine

## 2017-12-01 NOTE — Telephone Encounter (Signed)
I was calling to schedule foot surgery with Dr. Cannon Kettle. You can call me back at [phone]. Thank you.

## 2017-12-01 NOTE — Telephone Encounter (Signed)
Called pt and left her a voicemail letting her know that I was assisting Delydia this afternoon as she was not in. I told her I was calling to let her know we got the message and I had sent it to [facility] and that she would return her call sometime tomorrow. Told her if she had any other questions to call back at [phone] but that she would get a call back sometime tomorrow by [facility].

## 2017-12-02 ENCOUNTER — Telehealth: Payer: Self-pay | Admitting: *Deleted

## 2017-12-02 NOTE — Telephone Encounter (Signed)
I'm returning your call.  How can I help you?  "I'd like to schedule my surgery.  What's the soonest date you have?"  She can do it on 12/15/2017.  "I will take it.  I want to go ahead and get it over with."  Someone from the surgical center will call and give you the arrival time the Friday prior to your surgery date.

## 2017-12-15 ENCOUNTER — Encounter: Payer: Self-pay | Admitting: Sports Medicine

## 2017-12-15 DIAGNOSIS — D485 Neoplasm of uncertain behavior of skin: Secondary | ICD-10-CM | POA: Diagnosis not present

## 2017-12-15 DIAGNOSIS — M21542 Acquired clubfoot, left foot: Secondary | ICD-10-CM | POA: Diagnosis not present

## 2017-12-16 ENCOUNTER — Telehealth: Payer: Self-pay | Admitting: Sports Medicine

## 2017-12-16 NOTE — Telephone Encounter (Signed)
Phone call made to patient. Phone was answered and relative states that she is sleeping but doing good. I encouraged relative to tell Trishia to call our office back if she has any problems with her foot. Relative expressed understanding and states that she will relay the message. -Dr. Cannon Kettle

## 2017-12-19 ENCOUNTER — Encounter: Payer: Self-pay | Admitting: Sports Medicine

## 2017-12-24 ENCOUNTER — Ambulatory Visit (INDEPENDENT_AMBULATORY_CARE_PROVIDER_SITE_OTHER): Payer: Medicare Other

## 2017-12-24 ENCOUNTER — Encounter: Payer: Self-pay | Admitting: Sports Medicine

## 2017-12-24 ENCOUNTER — Ambulatory Visit (INDEPENDENT_AMBULATORY_CARE_PROVIDER_SITE_OTHER): Payer: Medicare Other | Admitting: Sports Medicine

## 2017-12-24 DIAGNOSIS — M216X2 Other acquired deformities of left foot: Secondary | ICD-10-CM | POA: Diagnosis not present

## 2017-12-24 DIAGNOSIS — M2040 Other hammer toe(s) (acquired), unspecified foot: Secondary | ICD-10-CM

## 2017-12-24 DIAGNOSIS — Z9889 Other specified postprocedural states: Secondary | ICD-10-CM

## 2017-12-24 NOTE — Progress Notes (Signed)
Subjective: Jill Larson is a 77 y.o. female patient seen today in office for POV #1 (DOS 12/15/2017), S/P excision of benign lesion left foot with ostectomy second metatarsal head.  Patient denies pain at surgical site, denies calf pain, denies headache, chest pain, shortness of breath, nausea, vomiting, fever, or chills. Patient states that she is doing okay but is having a lot of pain at the back of her heel has tried to put a heel cushion to pad the shoe however still very painful and tender to touch. No other issues noted.   Patient Active Problem List   Diagnosis Date Noted  . Chronic LBP 03/11/2015  . Clinical depression 03/11/2015  . Intestinal obstruction due to decreased peristalsis ([place]) 03/11/2015  . Pulmonary embolism ([place]) 03/11/2015  . SPL (spondylolisthesis) 03/11/2015  . OTHER DISEASES OF LUNG NOT ELSEWHERE CLASSIFIED 10/30/2009  . GASTRIC POLYP 06/19/2008  . PALPITATIONS 06/14/2008  . HYPERCHOLESTEROLEMIA 04/18/2008  . ANXIETY 04/18/2008  . HYPERTENSION, BORDERLINE 04/18/2008  . CONSTIPATION 04/18/2008  . BACK PAIN, LUMBAR 04/18/2008  . 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 OSTEOPOROSIS 04/18/2008  . MITRAL VALVE PROLAPSE, HX OF 04/18/2008    Current Outpatient Medications on File Prior to Visit  Medication Sig Dispense Refill  . alendronate (FOSAMAX) 70 MG tablet     . ALPRAZolam (XANAX) 0.5 MG tablet TAKE ONE-HALF TO ONE TABLET BY MOUTH THREE TIMES DAILY AS NEEDED FOR NERVES 100 tablet 1  . ALPRAZolam (XANAX) 1 MG tablet     . b complex vitamins capsule Take 1 capsule by mouth daily.    Marland Kitchen buPROPion (WELLBUTRIN) 75 MG tablet     . Cholecalciferol (VITAMIN D PO) Take 1 capsule by mouth daily.    . citalopram (CELEXA) 20 MG tablet Take 20 mg by mouth.    . cyclobenzaprine (FLEXERIL) 10 MG tablet Take 10 mg by mouth.    . escitalopram (LEXAPRO) 20 MG tablet     . Omega-3 Fatty Acids (FISH OIL PO) Take 1 capsule by mouth daily.    Marland Kitchen oxyCODONE-acetaminophen (PERCOCET/ROXICET)  5-325 MG tablet Take by mouth.     No current facility-administered medications on file prior to visit.     No Known Allergies  Objective: There were no vitals filed for this visit.  General: No acute distress, AAOx3  Left foot: Sutures intact with no gapping or dehiscence at surgical site, mild swelling to left forefoot with ecchymosis, no erythema, no warmth, no drainage, no signs of infection noted, Capillary fill time <3 seconds in all digits, gross sensation present via light touch to left foot. No open wound or lesion to the Achilles area or posterior heel.  There is mild pain with tenderness likely from rubbing and irritation from postop shoe.  To the left foot there is no pain or crepitation with range of motion.  No pain with calf compression.   Post Op Xray left foot: No acute osseous findings, consistent with postop status, soft tissue swelling within normal limits for post op status.   Assessment and Plan:  Problem List Items Addressed This Visit    None    Visit Diagnoses    Status post left foot surgery    -  Primary   Relevant Orders   DG Foot Complete Left   Prominent metatarsal head of left foot       Relevant Orders   DG Foot Complete Left   Hammer toe, unspecified laterality       Relevant Orders   DG  Foot Complete Left       -Patient seen and evaluated -X-rays reviewed -Applied dry sterile dressing to surgical site to left foot with offloading padding and changed postoperative shoe to a flat shoe to prevent rubbing posterior heel -Advised patient to make sure to keep dressings clean, dry, and intact to left surgical site, removing the ACE as needed  -Advised patient to limit activity to necessity  -Advised patient to ice and elevate as necessary  -Encourage offloading of heel when in bed to prevent further rubbing or irritation -Will plan for incision site check and removal of sutures in 2 weeks. In the meantime, patient to call office if any issues or  problems arise.   Landis Martins, DPM

## 2017-12-26 NOTE — Progress Notes (Signed)
DOS 12/15/17 Excision of painful lesion bottom of Lt foot and shave down of prominent bone Lt foot

## 2017-12-31 ENCOUNTER — Ambulatory Visit (INDEPENDENT_AMBULATORY_CARE_PROVIDER_SITE_OTHER): Payer: Medicare Other | Admitting: Sports Medicine

## 2017-12-31 ENCOUNTER — Encounter: Payer: Self-pay | Admitting: Sports Medicine

## 2017-12-31 DIAGNOSIS — Q828 Other specified congenital malformations of skin: Secondary | ICD-10-CM

## 2017-12-31 DIAGNOSIS — Z9889 Other specified postprocedural states: Secondary | ICD-10-CM

## 2017-12-31 DIAGNOSIS — M216X2 Other acquired deformities of left foot: Secondary | ICD-10-CM

## 2017-12-31 DIAGNOSIS — M2040 Other hammer toe(s) (acquired), unspecified foot: Secondary | ICD-10-CM

## 2017-12-31 DIAGNOSIS — M79672 Pain in left foot: Secondary | ICD-10-CM

## 2017-12-31 NOTE — Progress Notes (Signed)
Subjective: Jill Larson is a 77 y.o. female patient seen today in office for POV #2 (DOS 12/15/2017), S/P excision of benign lesion left foot with ostectomy second metatarsal head.  Patient denies pain at surgical site besides with removing sutures today, denies calf pain, denies headache, chest pain, shortness of breath, nausea, vomiting, fever, or chills.  Reports that heel is much better since we changed her to the flat shoe. No other issues noted.   Patient Active Problem List   Diagnosis Date Noted  . Chronic LBP 03/11/2015  . Clinical depression 03/11/2015  . Intestinal obstruction due to decreased peristalsis ([place]) 03/11/2015  . Pulmonary embolism ([place]) 03/11/2015  . SPL (spondylolisthesis) 03/11/2015  . OTHER DISEASES OF LUNG NOT ELSEWHERE CLASSIFIED 10/30/2009  . GASTRIC POLYP 06/19/2008  . PALPITATIONS 06/14/2008  . HYPERCHOLESTEROLEMIA 04/18/2008  . ANXIETY 04/18/2008  . HYPERTENSION, BORDERLINE 04/18/2008  . CONSTIPATION 04/18/2008  . BACK PAIN, LUMBAR 04/18/2008  . 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 OSTEOPOROSIS 04/18/2008  . MITRAL VALVE PROLAPSE, HX OF 04/18/2008    Current Outpatient Medications on File Prior to Visit  Medication Sig Dispense Refill  . alendronate (FOSAMAX) 70 MG tablet     . ALPRAZolam (XANAX) 0.5 MG tablet TAKE ONE-HALF TO ONE TABLET BY MOUTH THREE TIMES DAILY AS NEEDED FOR NERVES 100 tablet 1  . ALPRAZolam (XANAX) 1 MG tablet     . b complex vitamins capsule Take 1 capsule by mouth daily.    Marland Kitchen buPROPion (WELLBUTRIN) 75 MG tablet     . Cholecalciferol (VITAMIN D PO) Take 1 capsule by mouth daily.    . citalopram (CELEXA) 20 MG tablet Take 20 mg by mouth.    . cyclobenzaprine (FLEXERIL) 10 MG tablet Take 10 mg by mouth.    . escitalopram (LEXAPRO) 20 MG tablet     . Omega-3 Fatty Acids (FISH OIL PO) Take 1 capsule by mouth daily.    Marland Kitchen oxyCODONE-acetaminophen (PERCOCET/ROXICET) 5-325 MG tablet Take by mouth.     No current facility-administered  medications on file prior to visit.     No Known Allergies  Objective: There were no vitals filed for this visit.  General: No acute distress, AAOx3  Left foot: Sutures intact with no gapping or dehiscence at surgical site, mild swelling to left forefoot with ecchymosis, no erythema, no warmth, no drainage, no signs of infection noted, Capillary fill time <3 seconds in all digits, gross sensation present via light touch to left foot.  No pain or irritation noted to posterior heel at today's visit.  There is no tenderness to heel.  To the left foot there is no pain or crepitation with range of motion.  No pain with calf compression.   Assessment and Plan:  Problem List Items Addressed This Visit    None    Visit Diagnoses    Status post left foot surgery    -  Primary   Prominent metatarsal head of left foot       Hammer toe, unspecified laterality       Porokeratosis       Left foot pain           -Patient seen and evaluated -Sutures removed -Applied dry sterile dressing to surgical site to left foot with offloading padding; patient may remove in 1 week and shower allowing Steri-Strips to fall off on their own -Continue with postop shoe until next visit -Advised patient to limit activity to tolerance -Advised patient to ice and elevate as necessary  Advised patient to continue to monitor heel to make sure there is no recurrence of rubbing or irritation- -Will plan for incision site check and transitioning patient to normal shoe at next visit. In the meantime, patient to call office if any issues or problems arise.   Landis Martins, DPM

## 2018-01-14 ENCOUNTER — Ambulatory Visit (INDEPENDENT_AMBULATORY_CARE_PROVIDER_SITE_OTHER): Payer: Medicare Other | Admitting: Sports Medicine

## 2018-01-14 ENCOUNTER — Encounter: Payer: Self-pay | Admitting: Sports Medicine

## 2018-01-14 DIAGNOSIS — M79672 Pain in left foot: Secondary | ICD-10-CM

## 2018-01-14 DIAGNOSIS — M2040 Other hammer toe(s) (acquired), unspecified foot: Secondary | ICD-10-CM

## 2018-01-14 DIAGNOSIS — Z9889 Other specified postprocedural states: Secondary | ICD-10-CM

## 2018-01-14 DIAGNOSIS — Q828 Other specified congenital malformations of skin: Secondary | ICD-10-CM

## 2018-01-14 DIAGNOSIS — M216X2 Other acquired deformities of left foot: Secondary | ICD-10-CM

## 2018-01-14 NOTE — Progress Notes (Signed)
Subjective: Jill Larson is a 77 y.o. female patient seen today in office for POV #3 (DOS 12/15/2017), S/P excision of benign lesion left foot with ostectomy second metatarsal head.  Patient denies pain at surgical site except for a little swelling, denies calf pain, denies headache, chest pain, shortness of breath, nausea, vomiting, fever, or chills. No other issues noted.   Patient Active Problem List   Diagnosis Date Noted  . Chronic LBP 03/11/2015  . Clinical depression 03/11/2015  . Intestinal obstruction due to decreased peristalsis ([place]) 03/11/2015  . Pulmonary embolism ([place]) 03/11/2015  . SPL (spondylolisthesis) 03/11/2015  . OTHER DISEASES OF LUNG NOT ELSEWHERE CLASSIFIED 10/30/2009  . GASTRIC POLYP 06/19/2008  . PALPITATIONS 06/14/2008  . HYPERCHOLESTEROLEMIA 04/18/2008  . ANXIETY 04/18/2008  . HYPERTENSION, BORDERLINE 04/18/2008  . CONSTIPATION 04/18/2008  . BACK PAIN, LUMBAR 04/18/2008  . 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 OSTEOPOROSIS 04/18/2008  . MITRAL VALVE PROLAPSE, HX OF 04/18/2008    Current Outpatient Medications on File Prior to Visit  Medication Sig Dispense Refill  . alendronate (FOSAMAX) 70 MG tablet     . ALPRAZolam (XANAX) 0.5 MG tablet TAKE ONE-HALF TO ONE TABLET BY MOUTH THREE TIMES DAILY AS NEEDED FOR NERVES 100 tablet 1  . ALPRAZolam (XANAX) 1 MG tablet     . b complex vitamins capsule Take 1 capsule by mouth daily.    Marland Kitchen buPROPion (WELLBUTRIN) 75 MG tablet     . Cholecalciferol (VITAMIN D PO) Take 1 capsule by mouth daily.    . citalopram (CELEXA) 20 MG tablet Take 20 mg by mouth.    . cyclobenzaprine (FLEXERIL) 10 MG tablet Take 10 mg by mouth.    . escitalopram (LEXAPRO) 20 MG tablet     . Omega-3 Fatty Acids (FISH OIL PO) Take 1 capsule by mouth daily.    Marland Kitchen oxyCODONE-acetaminophen (PERCOCET/ROXICET) 5-325 MG tablet Take by mouth.     No current facility-administered medications on file prior to visit.     No Known Allergies  Objective: There were  no vitals filed for this visit.  General: No acute distress, AAOx3  Left foot: Incision well-healed at surgical site, with dry peeling skin present, minimal swelling to left forefoot, resolved ecchymosis, no erythema, no warmth, no drainage, no signs of infection noted, Capillary fill time <3 seconds in all digits, gross sensation present via light touch to left foot.   There is no tenderness to heel previous irritation resolved.  To the left foot there is no pain or crepitation with range of motion.  No pain with calf compression.   Assessment and Plan:  Problem List Items Addressed This Visit    None    Visit Diagnoses    Status post left foot surgery    -  Primary   Prominent metatarsal head of left foot       Hammer toe, unspecified laterality       Porokeratosis       Left foot pain          -Patient seen and evaluated -Incision healed well -Encouraged daily skin emollients -Dispensed Surgitube compression to assist with edema control to wear during the day  -May slowly transition to normal shoe  -Advised patient to limit activity to tolerance -Advised patient to ice and elevate as necessary  -Will plan for final post op check at next visit. In the meantime, patient to call office if any issues or problems arise.   Landis Martins, DPM

## 2018-01-22 ENCOUNTER — Other Ambulatory Visit: Payer: Self-pay | Admitting: Family

## 2018-01-23 ENCOUNTER — Other Ambulatory Visit: Payer: Self-pay | Admitting: Family

## 2018-01-23 DIAGNOSIS — Z139 Encounter for screening, unspecified: Secondary | ICD-10-CM

## 2018-01-23 DIAGNOSIS — M8580 Other specified disorders of bone density and structure, unspecified site: Secondary | ICD-10-CM

## 2018-02-24 ENCOUNTER — Inpatient Hospital Stay
Admission: RE | Admit: 2018-02-24 | Discharge: 2018-02-24 | Disposition: A | Discharge status: Home / Self Care | Payer: Medicare Other | Source: Ambulatory Visit | Attending: Family | Admitting: Family

## 2018-02-24 ENCOUNTER — Ambulatory Visit: Payer: Medicare Other

## 2018-02-25 ENCOUNTER — Encounter: Payer: Medicare Other | Admitting: Sports Medicine

## 2018-03-04 ENCOUNTER — Encounter: Payer: Medicare Other | Admitting: Sports Medicine

## 2018-03-09 ENCOUNTER — Other Ambulatory Visit: Payer: Self-pay | Admitting: Obstetrics & Gynecology

## 2018-03-19 ENCOUNTER — Encounter: Payer: Self-pay | Admitting: Sports Medicine

## 2018-03-19 ENCOUNTER — Ambulatory Visit (INDEPENDENT_AMBULATORY_CARE_PROVIDER_SITE_OTHER): Payer: Medicare Other | Admitting: Sports Medicine

## 2018-03-19 DIAGNOSIS — M2040 Other hammer toe(s) (acquired), unspecified foot: Secondary | ICD-10-CM | POA: Diagnosis not present

## 2018-03-19 DIAGNOSIS — M79672 Pain in left foot: Secondary | ICD-10-CM

## 2018-03-19 DIAGNOSIS — M216X2 Other acquired deformities of left foot: Secondary | ICD-10-CM | POA: Diagnosis not present

## 2018-03-19 DIAGNOSIS — Q828 Other specified congenital malformations of skin: Secondary | ICD-10-CM

## 2018-03-19 DIAGNOSIS — Z9889 Other specified postprocedural states: Secondary | ICD-10-CM

## 2018-03-19 DIAGNOSIS — E65 Localized adiposity: Secondary | ICD-10-CM

## 2018-03-19 NOTE — Progress Notes (Signed)
Subjective: Jill Larson is a 77 y.o. female patient seen today in office for POV #4 (DOS 12/15/2017), S/P excision of benign lesion left foot with ostectomy second metatarsal head.  Patient denies pain at surgical site except for a little pain from time to time underneath the second and third toe joints, denies calf pain, denies headache, chest pain, shortness of breath, nausea, vomiting, fever, or chills. No other issues noted.   Patient Active Problem List   Diagnosis Date Noted  . Chronic LBP 03/11/2015  . Clinical depression 03/11/2015  . Intestinal obstruction due to decreased peristalsis ([place]) 03/11/2015  . Pulmonary embolism ([place]) 03/11/2015  . SPL (spondylolisthesis) 03/11/2015  . OTHER DISEASES OF LUNG NOT ELSEWHERE CLASSIFIED 10/30/2009  . GASTRIC POLYP 06/19/2008  . PALPITATIONS 06/14/2008  . HYPERCHOLESTEROLEMIA 04/18/2008  . ANXIETY 04/18/2008  . HYPERTENSION, BORDERLINE 04/18/2008  . CONSTIPATION 04/18/2008  . BACK PAIN, LUMBAR 04/18/2008  . 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 OSTEOPOROSIS 04/18/2008  . MITRAL VALVE PROLAPSE, HX OF 04/18/2008    Current Outpatient Medications on File Prior to Visit  Medication Sig Dispense Refill  . alendronate (FOSAMAX) 70 MG tablet     . ALPRAZolam (XANAX) 0.5 MG tablet TAKE ONE-HALF TO ONE TABLET BY MOUTH THREE TIMES DAILY AS NEEDED FOR NERVES 100 tablet 1  . ALPRAZolam (XANAX) 1 MG tablet     . b complex vitamins capsule Take 1 capsule by mouth daily.    Marland Kitchen buPROPion (WELLBUTRIN) 75 MG tablet     . Cholecalciferol (VITAMIN D PO) Take 1 capsule by mouth daily.    . citalopram (CELEXA) 20 MG tablet Take 20 mg by mouth.    . cyclobenzaprine (FLEXERIL) 10 MG tablet Take 10 mg by mouth.    . escitalopram (LEXAPRO) 20 MG tablet     . Omega-3 Fatty Acids (FISH OIL PO) Take 1 capsule by mouth daily.    Marland Kitchen oxyCODONE-acetaminophen (PERCOCET/ROXICET) 5-325 MG tablet Take by mouth.     No current facility-administered medications on file prior to  visit.     No Known Allergies  Objective: There were no vitals filed for this visit.  General: No acute distress, AAOx3  Left foot: Incision well-healed at surgical site, with minimal reactive keratosis/dry peeling skin present, minimal swelling to left forefoot, resolved ecchymosis, no erythema, no warmth, no drainage, no signs of infection noted, Capillary fill time <3 seconds in all digits, gross sensation present via light touch to left foot.  There is mild tenderness to palpation at the second and third MTPJ's to the left foot there is no pain or crepitation with range of motion.  No pain with calf compression.   Assessment and Plan:  Problem List Items Addressed This Visit    None    Visit Diagnoses    Status post left foot surgery    -  Primary   Prominent metatarsal head of left foot       Hammer toe, unspecified laterality       Porokeratosis       Left foot pain       Fat pad syndrome          -Patient seen and evaluated -Applied offloading pad-and advised patient to get over-the-counter metatarsal padding to use to prevent irritation underneath the second and third metatarsophalangeal joints on the left foot -Encouraged continue with daily skin emollients -Continue with good supportive shoes -Advised patient may resume all normal activities -Patient is discharged from postoperative care area will heal no  other follow-up needed for this particular condition. In the meantime, patient to call office if any issues or problems arise.   Landis Martins, DPM

## 2018-03-20 ENCOUNTER — Encounter: Payer: Medicare Other | Admitting: Sports Medicine

## 2018-04-03 ENCOUNTER — Ambulatory Visit
Admission: RE | Admit: 2018-04-03 | Discharge: 2018-04-03 | Disposition: A | Discharge status: Home / Self Care | Payer: Medicare Other | Source: Ambulatory Visit | Attending: Family | Admitting: Family

## 2018-04-03 ENCOUNTER — Inpatient Hospital Stay
Admission: RE | Admit: 2018-04-03 | Discharge: 2018-04-03 | Disposition: A | Discharge status: Home / Self Care | Payer: Medicare Other | Source: Ambulatory Visit | Attending: Family | Admitting: Family

## 2018-04-03 DIAGNOSIS — Z139 Encounter for screening, unspecified: Secondary | ICD-10-CM

## 2018-05-21 ENCOUNTER — Other Ambulatory Visit: Payer: Medicare Other

## 2018-12-21 ENCOUNTER — Encounter: Payer: Self-pay | Admitting: Neurology

## 2018-12-31 NOTE — Progress Notes (Deleted)
Jill Larson was seen today in the movement disorders clinic for neurologic consultation at the request of York, [name], NP.  The patient is a 78 year old female with a history of anxiety and depression who presents today for the evaluation of tremor.  Prior medical records have been reviewed.  Tremor: {yes no:314532}   How long has it been going on? ***  At rest or with activation?  ***  When is it noted the most?  ***  Fam hx of tremor?  {yes YP:950932}  Located where?  ***  Affected by caffeine:  {yes no:314532}  Affected by alcohol:  {yes no:314532}  Affected by stress:  {yes no:314532}  Affected by fatigue:  {yes no:314532}  Spills soup if on spoon:  {yes no:314532}  Spills glass of liquid if full:  {yes no:314532}  Affects ADL's (tying shoes, brushing teeth, etc):  {yes no:314532}  Tremor inducing meds:  {yes no:314532} (prior Zyprexa and Abilify)  Other Specific Symptoms:  Voice: *** Sleep: ***  Vivid Dreams:  {yes no:314532}  Acting out dreams:  {yes no:314532} Wet Pillows: {yes no:314532} Postural symptoms:  {yes no:314532}  Falls?  {yes no:314532} Bradykinesia symptoms: {parkinson brady:18041} Loss of smell:  {yes no:314532} Loss of taste:  {yes no:314532} Urinary Incontinence:  {yes no:314532} Difficulty Swallowing:  {yes no:314532} Handwriting, micrographia: {yes no:314532} Trouble with ADL's:  {yes no:314532}  Trouble buttoning clothing: {yes no:314532} Depression:  {yes no:314532} Memory changes:  {yes no:314532} Hallucinations:  {yes no:314532}  visual distortions: {yes no:314532} N/V:  {yes no:314532} Lightheaded:  {yes no:314532}  Syncope: {yes no:314532} Diplopia:  {yes no:314532} Dyskinesia:  {yes no:314532}  Neuroimaging of the brain has *** previously been performed.  It *** available for my review today.  Patient had a CT of the brain on December 22, 2018.  I have a copy of the report, but not the films.  There was evidence of mild to moderate  small vessel disease.  PREVIOUS MEDICATIONS: {Parkinson's RX:18200}  ALLERGIES:  No Known Allergies  CURRENT MEDICATIONS:  Outpatient Encounter Medications as of 01/04/2019  Medication Sig  . alendronate (FOSAMAX) 70 MG tablet   . ALPRAZolam (XANAX) 0.5 MG tablet TAKE ONE-HALF TO ONE TABLET BY MOUTH THREE TIMES DAILY AS NEEDED FOR NERVES  . ALPRAZolam (XANAX) 1 MG tablet   . b complex vitamins capsule Take 1 capsule by mouth daily.  Marland Kitchen buPROPion (WELLBUTRIN) 75 MG tablet   . Cholecalciferol (VITAMIN D PO) Take 1 capsule by mouth daily.  . citalopram (CELEXA) 20 MG tablet Take 20 mg by mouth.  . cyclobenzaprine (FLEXERIL) 10 MG tablet Take 10 mg by mouth.  . escitalopram (LEXAPRO) 20 MG tablet   . Omega-3 Fatty Acids (FISH OIL PO) Take 1 capsule by mouth daily.  Marland Kitchen oxyCODONE-acetaminophen (PERCOCET/ROXICET) 5-325 MG tablet Take by mouth.   No facility-administered encounter medications on file as of 01/04/2019.     PAST MEDICAL HISTORY:   Past Medical History:  Diagnosis Date  . Anxiety   . Benign fundic gland polyps of stomach 2001  . DDD (degenerative disc disease)   . Depression   . Fibromyalgia   . Hyperlipidemia   . Hypertension   . Mitral valve prolapse   . Osteoporosis   . Scoliosis     PAST SURGICAL HISTORY:   Past Surgical History:  Procedure Laterality Date  . BREAST EXCISIONAL BIOPSY Right pt unsure  . BREAST SURGERY     right to remove calcium deposits  . TONSILLECTOMY AND  ADENOIDECTOMY    . TUBAL LIGATION      SOCIAL HISTORY:   Social History   Socioeconomic History  . Marital status: Married    Spouse name: Not on file  . Number of children: 2  . Years of education: Not on file  . Highest education level: Not on file  Occupational History  . Occupation: Retired Associate Professor  . Financial resource strain: Not on file  . Food insecurity:    Worry: Not on file    Inability: Not on file  . Transportation needs:    Medical: Not on file     Non-medical: Not on file  Tobacco Use  . Smoking status: Former Smoker    Types: Cigarettes  . Smokeless tobacco: Never Used  Substance and Sexual Activity  . Alcohol use: Yes    Comment: Social  . Drug use: No  . Sexual activity: Not on file  Lifestyle  . Physical activity:    Days per week: Not on file    Minutes per session: Not on file  . Stress: Not on file  Relationships  . Social connections:    Talks on phone: Not on file    Gets together: Not on file    Attends religious service: Not on file    Active member of club or organization: Not on file    Attends meetings of clubs or organizations: Not on file    Relationship status: Not on file  . Intimate partner violence:    Fear of current or ex partner: Not on file    Emotionally abused: Not on file    Physically abused: Not on file    Forced sexual activity: Not on file  Other Topics Concern  . Not on file  Social History Narrative  . Not on file    FAMILY HISTORY:   Family Status  Relation Name Status  . Father  (Not Specified)  . Sister  (Not Specified)  . Neg Hx  (Not Specified)    ROS:  ROS  PHYSICAL EXAMINATION:    VITALS:  There were no vitals filed for this visit.  GEN:  The patient appears stated age and is in NAD. HEENT:  Normocephalic, atraumatic.  The mucous membranes are moist. The superficial temporal arteries are without ropiness or tenderness. CV:  RRR Lungs:  CTAB Neck/HEME:  There are no carotid bruits bilaterally.  Neurological examination:  Orientation: The patient is alert and oriented x3. Fund of knowledge is appropriate.  Recent and remote memory are intact.  Attention and concentration are normal.    Able to name objects and repeat phrases. Cranial nerves: There is good facial symmetry. Pupils are equal round and reactive to light bilaterally. Fundoscopic exam reveals clear margins bilaterally. Extraocular muscles are intact. The visual fields are full to confrontational  testing. The speech is fluent and clear. Soft palate rises symmetrically and there is no tongue deviation. Hearing is intact to conversational tone. Sensation: Sensation is intact to light and pinprick throughout (facial, trunk, extremities). Vibration is intact at the bilateral big toe. There is no extinction with double simultaneous stimulation. There is no sensory dermatomal level identified. Motor: Strength is 5/5 in the bilateral upper and lower extremities.   Shoulder shrug is equal and symmetric.  There is no pronator drift. Deep tendon reflexes: Deep tendon reflexes are 2/4 at the bilateral biceps, triceps, brachioradialis, patella and achilles. Plantar responses are downgoing bilaterally.  Movement examination: Tone: There is ***tone in  the bilateral upper extremities.  The tone in the lower extremities is ***.  Abnormal movements: *** Coordination:  There is *** decremation with RAM's, *** Gait and Station: The patient has *** difficulty arising out of a deep-seated chair without the use of the hands. The patient's stride length is ***.  The patient has a *** pull test.      ASSESSMENT/PLAN:  ***  Cc:  Imagene Riches, NP

## 2019-01-04 ENCOUNTER — Ambulatory Visit: Payer: Medicare Other | Admitting: Neurology

## 2019-01-12 ENCOUNTER — Encounter: Payer: Self-pay | Admitting: Neurology

## 2019-01-19 ENCOUNTER — Ambulatory Visit: Payer: Medicare Other | Admitting: Neurology

## 2019-01-21 ENCOUNTER — Ambulatory Visit: Payer: Medicare Other | Admitting: Neurology

## 2019-03-05 NOTE — Progress Notes (Signed)
Pt had evisit scheduled.  Invited to video visit multiple times and called on phone multiple times.  VM left for patient.  NR 30 min after appointment time (started calling 15 min prior to appointment time).

## 2019-03-08 ENCOUNTER — Telehealth: Payer: Medicare Other | Admitting: Neurology

## 2019-03-08 ENCOUNTER — Other Ambulatory Visit: Payer: Self-pay

## 2019-03-08 ENCOUNTER — Telehealth: Payer: Self-pay

## 2019-03-08 NOTE — Telephone Encounter (Signed)
Attempted to call patient on Friday 03/05/19 to go over appt information for appt on today no answer left message.  Attempted to call patient again today for appt today called 8 times left message no answer no return call.  At this point patient has NO SHOW appt for NEW PATIENT

## 2019-03-18 ENCOUNTER — Ambulatory Visit: Payer: Medicare Other | Admitting: Neurology

## 2019-04-20 ENCOUNTER — Emergency Department (HOSPITAL_COMMUNITY)
Admission: EM | Admit: 2019-04-20 | Discharge: 2019-04-20 | Disposition: A | Discharge status: Home / Self Care | Payer: Medicare Other | Attending: Emergency Medicine | Admitting: Emergency Medicine

## 2019-04-20 DIAGNOSIS — Z86711 Personal history of pulmonary embolism: Secondary | ICD-10-CM | POA: Diagnosis not present

## 2019-04-20 DIAGNOSIS — Z87891 Personal history of nicotine dependence: Secondary | ICD-10-CM | POA: Insufficient documentation

## 2019-04-20 DIAGNOSIS — R55 Syncope and collapse: Secondary | ICD-10-CM | POA: Diagnosis not present

## 2019-04-20 DIAGNOSIS — I10 Essential (primary) hypertension: Secondary | ICD-10-CM | POA: Insufficient documentation

## 2019-04-20 DIAGNOSIS — Z79899 Other long term (current) drug therapy: Secondary | ICD-10-CM | POA: Diagnosis not present

## 2019-04-20 LAB — CBC WITH DIFFERENTIAL/PLATELET
Abs Immature Granulocytes: 0.02 10*3/uL (ref 0.00–0.07)
Basophils Absolute: 0 10*3/uL (ref 0.0–0.1)
Basophils Relative: 0 %
Eosinophils Absolute: 0 10*3/uL (ref 0.0–0.5)
Eosinophils Relative: 0 %
HCT: 39.5 % (ref 36.0–46.0)
Hemoglobin: 13.3 g/dL (ref 12.0–15.0)
Immature Granulocytes: 0 %
Lymphocytes Relative: 26 %
Lymphs Abs: 1.3 10*3/uL (ref 0.7–4.0)
MCH: 31.1 pg (ref 26.0–34.0)
MCHC: 33.7 g/dL (ref 30.0–36.0)
MCV: 92.5 fL (ref 80.0–100.0)
Monocytes Absolute: 0.3 10*3/uL (ref 0.1–1.0)
Monocytes Relative: 7 %
Neutro Abs: 3.2 10*3/uL (ref 1.7–7.7)
Neutrophils Relative %: 67 %
Platelets: 212 10*3/uL (ref 150–400)
RBC: 4.27 MIL/uL (ref 3.87–5.11)
RDW: 12.8 % (ref 11.5–15.5)
WBC: 4.9 10*3/uL (ref 4.0–10.5)
nRBC: 0 % (ref 0.0–0.2)

## 2019-04-20 LAB — COMPREHENSIVE METABOLIC PANEL
ALT: 15 U/L (ref 0–44)
AST: 21 U/L (ref 15–41)
Albumin: 3.9 g/dL (ref 3.5–5.0)
Alkaline Phosphatase: 73 U/L (ref 38–126)
Anion gap: 10 (ref 5–15)
BUN: 18 mg/dL (ref 8–23)
CO2: 26 mmol/L (ref 22–32)
Calcium: 8.8 mg/dL — ABNORMAL LOW (ref 8.9–10.3)
Chloride: 104 mmol/L (ref 98–111)
Creatinine, Ser: 0.85 mg/dL (ref 0.44–1.00)
GFR calc Af Amer: >60 mL/min (ref >60)
GFR calc non Af Amer: >60 mL/min (ref >60)
Glucose, Bld: 106 mg/dL — ABNORMAL HIGH (ref 70–99)
Potassium: 3.9 mmol/L (ref 3.5–5.1)
Sodium: 140 mmol/L (ref 135–145)
Total Bilirubin: 0.2 mg/dL — ABNORMAL LOW (ref 0.3–1.2)
Total Protein: 7 g/dL (ref 6.5–8.1)

## 2019-04-20 MED ORDER — SODIUM CHLORIDE 0.9 % IV BOLUS
1000.0000 mL | Freq: Once | INTRAVENOUS | Status: AC
  Administered 2019-04-20: 1000 mL via INTRAVENOUS

## 2019-04-20 NOTE — ED Triage Notes (Signed)
Patient is from home and transported via [facility]. Patient was washing dishes when she started to feel weak, dizzy, and nausea. Patient called for her daughter in another room of the house, she came to her mothers side and sat her mother in a chair. Patient had a syncopal episode while sitting in the chair for a few seconds. Patient stated to EMS she has not drunk any fluids today despite eating lunch earlier. Patient is orthostatic positive, 112/70 sitting and 96/50 standing with increased nausea and dizziness during the process.

## 2019-04-20 NOTE — ED Triage Notes (Deleted)
Per EMS-patient was outside doing yard work when she has a mechanical fall, stepped backwards and tripped over some pipes-left wrist pain-hit head, no LOC, no blood thinners-patient in [place]-no dizziness

## 2019-04-20 NOTE — ED Notes (Signed)
Pt d/c home per MD order. Discharge summary reviewed, pt verbalizes understanding. Reports daughter is coming for discharge ride home.

## 2019-04-20 NOTE — ED Provider Notes (Signed)
[facility] DEPT Provider Note   CSN: [id number] Arrival date & time: 04/20/19  1631    History   Chief Complaint Chief Complaint  Patient presents with  . Loss of Consciousness    HPI Jill Larson is a 78 y.o. female.     78 year old female with past medical history below including hypertension, hyperlipidemia, PE, depression who presents with syncope.  Just prior to arrival, the patient was standing in her kitchen washing dishes when she began feeling lightheaded associated with her vision going in and out, nausea, and weakness.  She called for her daughter who assisted her to a chair and then daughter witnessed her lose consciousness for a few seconds while sitting.  No fall or seizure activity.  Patient denies any chest pain, shortness of breath, vomiting, diarrhea, fevers, or recent illness.  She ate lunch today but has not had much to drink.  This is never happened before.  She denies any recent changes to medications.  The history is provided by the patient.  Loss of Consciousness   Past Medical History:  Diagnosis Date  . Anxiety   . Benign fundic gland polyps of stomach 2001  . DDD (degenerative disc disease)   . Depression   . Fibromyalgia   . Hyperlipidemia   . Hypertension   . Mitral valve prolapse   . Osteoporosis   . Scoliosis     Patient Active Problem List   Diagnosis Date Noted  . Chronic LBP 03/11/2015  . Clinical depression 03/11/2015  . Intestinal obstruction due to decreased peristalsis ([place]) 03/11/2015  . Pulmonary embolism ([place]) 03/11/2015  . SPL (spondylolisthesis) 03/11/2015  . OTHER DISEASES OF LUNG NOT ELSEWHERE CLASSIFIED 10/30/2009  . GASTRIC POLYP 06/19/2008  . PALPITATIONS 06/14/2008  . HYPERCHOLESTEROLEMIA 04/18/2008  . ANXIETY 04/18/2008  . HYPERTENSION, BORDERLINE 04/18/2008  . CONSTIPATION 04/18/2008  . BACK PAIN, LUMBAR 04/18/2008  . 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 OSTEOPOROSIS 04/18/2008  .  MITRAL VALVE PROLAPSE, HX OF 04/18/2008    Past Surgical History:  Procedure Laterality Date  . BREAST EXCISIONAL BIOPSY Right pt unsure  . BREAST SURGERY     right to remove calcium deposits  . TONSILLECTOMY AND ADENOIDECTOMY    . TUBAL LIGATION       OB History   No obstetric history on file.      Home Medications    Prior to Admission medications   Medication Sig Start Date End Date Taking? Authorizing Provider  alendronate (FOSAMAX) 70 MG tablet  07/13/14   [provider]  ALPRAZolam Duanne Moron) 0.5 MG tablet TAKE ONE-HALF TO ONE TABLET BY MOUTH THREE TIMES DAILY AS NEEDED FOR NERVES 05/13/11   Noralee Space, MD  ALPRAZolam Duanne Moron) 1 MG tablet  12/15/16   [provider]  b complex vitamins capsule Take 1 capsule by mouth daily.    [provider]  buPROPion [facility]) 75 MG tablet  11/19/16   [provider]  Cholecalciferol (VITAMIN D PO) Take 1 capsule by mouth daily.    [provider]  citalopram (CELEXA) 20 MG tablet Take 20 mg by mouth. 03/13/15   [provider]  cyclobenzaprine (FLEXERIL) 10 MG tablet Take 10 mg by mouth. 03/13/15   [provider]  escitalopram (LEXAPRO) 20 MG tablet  12/02/16   [provider]  OLANZapine (ZYPREXA) 5 MG tablet  12/17/18   [provider]  Omega-3 Fatty Acids (FISH OIL PO) Take 1 capsule by mouth daily.  [provider]  oxyCODONE-acetaminophen (PERCOCET/ROXICET) 5-325 MG tablet Take by mouth. 03/13/15   [provider]    Family History Family History  Problem Relation Age of Onset  . Hypertension Father   . Pancreatic cancer Sister   . Colon cancer Neg Hx     Social History Social History   Tobacco Use  . Smoking status: Former Smoker    Types: Cigarettes  . Smokeless tobacco: Never Used  Substance Use Topics  . Alcohol use: Yes    Comment: Social  . Drug use: No     Allergies   Patient has no known allergies.    Review of Systems Review of Systems  Cardiovascular: Positive for syncope.   All other systems reviewed and are negative except that which was mentioned in HPI   Physical Exam Updated Vital Signs BP 133/63   Pulse 69   Temp 97.7 F (36.5 C) (Oral)   Resp 14   Ht 5\' 1"  (1.549 m)   Wt 54.4 kg   SpO2 99%   BMI 22.67 kg/m   Physical Exam Vitals signs and nursing note reviewed.  Constitutional:      General: She is not in acute distress.    Appearance: She is well-developed.  HENT:     Head: Normocephalic and atraumatic.  Eyes:     Conjunctiva/sclera: Conjunctivae normal.  Neck:     Musculoskeletal: Neck supple.  Cardiovascular:     Rate and Rhythm: Normal rate and regular rhythm.     Heart sounds: Normal heart sounds. No murmur.  Pulmonary:     Effort: Pulmonary effort is normal.     Breath sounds: Normal breath sounds.  Abdominal:     General: Bowel sounds are normal. There is no distension.     Palpations: Abdomen is soft.     Tenderness: There is no abdominal tenderness.  Musculoskeletal:        General: No swelling.  Skin:    General: Skin is warm and dry.  Neurological:     Mental Status: She is alert and oriented to person, place, and time.     Comments: Fluent speech  Psychiatric:        Judgment: Judgment normal.      ED Treatments / Results  Labs (all labs ordered are listed, but only abnormal results are displayed) Labs Reviewed  COMPREHENSIVE METABOLIC PANEL - Abnormal; Notable for the following components:      Result Value   Glucose, Bld 106 (*)    Calcium 8.8 (*)    Total Bilirubin 0.2 (*)    All other components within normal limits  CBC WITH DIFFERENTIAL/PLATELET    EKG EKG Interpretation  Date/Time:  Tuesday April 20 2019 16:55:44 EDT Ventricular Rate:  60 PR Interval:    QRS Duration: 95 QT Interval:  434 QTC Calculation: 434 R Axis:   15 Text Interpretation:  Sinus rhythm Low voltage, precordial leads lower voltages but  otherwise similar to previous Confirmed by Theotis Burrow [phone]) on 04/20/2019 5:29:04 PM   Radiology No results found.  Procedures Procedures (including critical care time)  Medications Ordered in ED Medications  sodium chloride 0.9 % bolus 1,000 mL (1,000 mLs Intravenous New Bag/Given 04/20/19 1743)     Initial Impression / Assessment and Plan / ED Course  I have reviewed the triage vital signs and the nursing notes.  Pertinent labs & imaging results that were available during my care of the patient were reviewed by me and considered  in my medical decision making (see chart for details).       Comfortable on exam, normal vital signs.  She had been orthostatic for EMS, orthostatics here are reassuring.  EKG without arrhythmia or ischemic changes and patient denies any symptoms to suggest ACS.  Her screening lab work shows reassuring CBC and no evidence of severe dehydration.  Her prodrome of lightheadedness, vision changes, nausea suggest vasovagal syncope and she has had no palpitations, SOB, or other concerning features to suggest arrhythmia, PE, or other life-threatening process.  Ambulated in the ED here without problems to receiving fluid bolus.  I have discussed the importance of hydration and recommended that she follow-up with PCP.  I have extensively reviewed return precautions including any repeat episodes or new sx such as heart racing, shortness of breath, or chest pain.  She voiced understanding.  Final Clinical Impressions(s) / ED Diagnoses   Final diagnoses:  Syncope, unspecified syncope type    ED Discharge Orders    None       , Wenda Overland, MD 04/20/19 [phone]

## 2019-04-20 NOTE — ED Notes (Signed)
Pt ambulated well with no assistance

## 2019-04-20 NOTE — ED Notes (Signed)
EDP at bedside  

## 2019-07-08 DIAGNOSIS — R251 Tremor, unspecified: Secondary | ICD-10-CM | POA: Insufficient documentation

## 2019-07-15 ENCOUNTER — Ambulatory Visit: Payer: Medicare Other | Admitting: Neurology

## 2019-07-15 ENCOUNTER — Other Ambulatory Visit: Payer: Self-pay

## 2019-07-15 ENCOUNTER — Telehealth: Payer: Self-pay

## 2019-07-15 NOTE — Telephone Encounter (Signed)
Pt did not show for their appt with Dr. Athar today.  

## 2019-08-26 ENCOUNTER — Ambulatory Visit (INDEPENDENT_AMBULATORY_CARE_PROVIDER_SITE_OTHER): Payer: Medicare Other | Admitting: Neurology

## 2019-08-26 ENCOUNTER — Encounter: Payer: Self-pay | Admitting: Neurology

## 2019-08-26 ENCOUNTER — Other Ambulatory Visit: Payer: Self-pay

## 2019-08-26 ENCOUNTER — Encounter

## 2019-08-26 VITALS — BP 123/75 | HR 75 | Temp 97.4°F | Ht 61.0 in | Wt 114.0 lb

## 2019-08-26 DIAGNOSIS — R252 Cramp and spasm: Secondary | ICD-10-CM | POA: Diagnosis not present

## 2019-08-26 DIAGNOSIS — R251 Tremor, unspecified: Secondary | ICD-10-CM

## 2019-08-26 NOTE — Progress Notes (Signed)
Subjective:    Patient ID: Jill Larson is a 78 y.o. female.  HPI     Jill Age, MD, PhD [facility] [address] [place], [place] [address]  Dear Jill Larson,  I saw your patient, Jill Larson, upon your kind request in the neurologic clinic today for initial consultation of her tremors.  The patient is unaccompanied today.  She missed an appointment on 07/15/2019. As you know, Jill Larson is a 78 year old right-handed woman with an underlying medical history of anxiety, depression, osteoporosis, scoliosis, status post back surgery, mitral valve prolapse, degenerative disc disease, fibromyalgia, hypertension, and hyperlipidemia, who reports hand tremors for the past 8 months and also intermittent jerking in both upper extremities particularly when she tries to do something with her hands or hold something.  She has no family history of essential tremor or Parkinson's disease. I reviewed your office records, specifically her appointment with you from 12/14/2018.  She reported residual anxiety and refractory depression.  Of note, she is on Xanax 1 mg strength 3 times daily prn, she reports taking it twice daily on average, she also takes generic Lexapro 20 mg daily, Zyprexa 5 mg at night.  She has been on Zyprexa for months.  She has had some changes in her antidepressant medication, tried Wellbutrin, sertraline and Celexa as I understand.  With the current regimen she has residual anxiety and depression and has noted correlation with her anxiety level and tremors.  She has started seeing a psychiatrist out of [place] as I understand and had 1 appointment and follow-up pending next week.  She had a head CT without contrast on 12/22/2018 and I reviewed the results:  Impression: No evidence of acute intracranial abnormality.  Mild to moderate chronic small vessel ischemic changes in the cerebral white matter. Of note, she presented to the emergency room at  [facility] on 04/20/2019 after a syncopal spell.  I reviewed the emergency room records.  Vasovagal syncope was suspected.  She was treated symptomatically with IV fluid.  Her daughter lives with her and has done so for the past 4 years.  Of note, the patient recently lost her husband in September 2020.  He had dementia.  She does not always hydrate very well, estimates that she drinks about 3-4 small bottles of water per day, 8 ounces each.  She does not drink caffeine on a day-to-day basis.    Her Past Medical History Is Significant For: Past Medical History:  Diagnosis Date  . Anxiety   . Benign fundic gland polyps of stomach 2001  . DDD (degenerative disc disease)   . Depression   . Depression   . Fibromyalgia   . Hyperlipidemia   . Hypertension   . Mitral valve prolapse   . Osteoporosis   . Scoliosis     Her Past Surgical History Is Significant For: Past Surgical History:  Procedure Laterality Date  . BACK SURGERY    . BREAST EXCISIONAL BIOPSY Right pt unsure  . BREAST SURGERY     right to remove calcium deposits  . TONSILLECTOMY AND ADENOIDECTOMY    . TUBAL LIGATION      Her Family History Is Significant For: Family History  Problem Relation Larson of Onset  . Hypertension Father   . Pancreatic cancer Sister   . Colon cancer Neg Hx     Her Social History Is Significant For: Social History   Socioeconomic History  . Marital status: Married  Spouse name: Not on file  . Number of children: 2  . Years of education: Not on file  . Highest education level: Not on file  Occupational History  . Occupation: Retired Associate Professor  . Financial resource strain: Not on file  . Food insecurity    Worry: Not on file    Inability: Not on file  . Transportation needs    Medical: Not on file    Non-medical: Not on file  Tobacco Use  . Smoking status: Former Smoker    Types: Cigarettes  . Smokeless tobacco: Never Used  Substance and Sexual Activity   . Alcohol use: Yes    Comment: Social  . Drug use: No  . Sexual activity: Not on file  Lifestyle  . Physical activity    Days per week: Not on file    Minutes per session: Not on file  . Stress: Not on file  Relationships  . Social Herbalist on phone: Not on file    Gets together: Not on file    Attends religious service: Not on file    Active member of club or organization: Not on file    Attends meetings of clubs or organizations: Not on file    Relationship status: Not on file  Other Topics Concern  . Not on file  Social History Narrative  . Not on file    Her Allergies Are:  No Known Allergies:   Her Current Medications Are:  Outpatient Encounter Medications as of 08/26/2019  Medication Sig  . ALPRAZolam (XANAX) 1 MG tablet 2 (two) times daily.   Marland Kitchen b complex vitamins capsule Take 1 capsule by mouth daily.  . Cholecalciferol (VITAMIN D PO) Take 1 capsule by mouth daily.  Marland Kitchen escitalopram (LEXAPRO) 20 MG tablet 10 mg.   . OLANZapine (ZYPREXA) 5 MG tablet   . Omega-3 Fatty Acids (FISH OIL PO) Take 1 capsule by mouth daily.  . [DISCONTINUED] alendronate (FOSAMAX) 70 MG tablet   . [DISCONTINUED] alendronate (FOSAMAX) 70 MG tablet Take by mouth.  . [DISCONTINUED] ALPRAZolam (XANAX) 0.5 MG tablet TAKE ONE-HALF TO ONE TABLET BY MOUTH THREE TIMES DAILY AS NEEDED FOR NERVES (Patient not taking: Reported on 08/26/2019)  . [DISCONTINUED] ALPRAZolam (XANAX) 1 MG tablet   . [DISCONTINUED] buPROPion (WELLBUTRIN) 75 MG tablet   . [DISCONTINUED] citalopram (CELEXA) 20 MG tablet Take 20 mg by mouth.  . [DISCONTINUED] citalopram (CELEXA) 20 MG tablet Take by mouth.  . [DISCONTINUED] cyclobenzaprine (FLEXERIL) 10 MG tablet Take 10 mg by mouth.  . [DISCONTINUED] cyclobenzaprine (FLEXERIL) 10 MG tablet Take by mouth.  . [DISCONTINUED] escitalopram (LEXAPRO) 10 MG tablet   . [DISCONTINUED] OLANZapine (ZYPREXA) 5 MG tablet   . [DISCONTINUED] oxyCODONE-acetaminophen  (PERCOCET/ROXICET) 5-325 MG tablet Take by mouth.   No facility-administered encounter medications on file as of 08/26/2019.   :   Review of Systems:  Out of a complete 14 point review of systems, all are reviewed and negative with the exception of these symptoms as listed below: Review of Systems  Constitutional: Negative.   HENT: Negative.   Eyes: Positive for itching.  Respiratory: Negative.   Cardiovascular: Negative.   Gastrointestinal: Negative.   Endocrine: Negative.   Genitourinary: Negative.   Musculoskeletal: Negative.   Skin: Negative.   Neurological: Positive for tremors.  Psychiatric/Behavioral: Negative.     Objective:  Neurological Exam  Physical Exam Physical Examination:   Vitals:   08/26/19 1433  BP: 123/75  Pulse:  75  Temp: (!) 97.4 F (36.3 C)    General Examination: The patient is a very pleasant 78 y.o. female in no acute distress. She appears well-developed and well-nourished and well groomed. She is quiet, mildly anxious appearing.  HEENT: Normocephalic, atraumatic, pupils are equal, round and reactive to light, Extraocular tracking is well preserved, she does appear to have a slightly decreased eye blink rate, otherwise minimal facial masking or normal facial animation is noted.  Speech is softer, no dysarthria or voice tremor, she has no lip, neck or jaw tremor, neck is fairly supple, no significant nuchal rigidity noted, airway examination reveals mild mouth dryness, tongue protrudes centrally in palate elevates symmetrically.  She has no abnormal involuntary facial movements or mouth movements.  Chest: Clear to auscultation without wheezing, rhonchi or crackles noted.  Heart: S1+S2+0, regular and normal without murmurs, rubs or gallops noted.   Abdomen: Soft, non-tender and non-distended with normal bowel sounds appreciated on auscultation.  Extremities: There is no pitting edema in the distal lower extremities bilaterally.   Skin: Warm and  dry without trophic changes noted.  Musculoskeletal: exam reveals no obvious joint deformities, tenderness or joint swelling or erythema.   Neurologically:  Mental status: The patient is awake, alert and oriented in all 4 spheres. Her immediate and remote memory, attention, language skills and fund of knowledge are appropriate. There is no evidence of aphasia, agnosia, apraxia or anomia. Speech is clear with normal prosody and enunciation. Thought process is linear. Mood is constricted and affect is blunted.  Cranial nerves II - XII are as described above under HEENT exam. In addition: shoulder shrug is normal with equal shoulder height noted. Motor exam: Normal bulk, strength is noted. Slight cogwheeling noted in both upper extremities. There is no Obvious resting tremor with the exception of a slight and very intermittent right hand resting tremor noted once or twice.  On Archimedes spiral drawing, she has insecurity with both hands.  No drift, Minimal bilateral upper extremity postural tremor noted.  Reflexes are 1-2+ throughout including ankles, toes are downgoing, fine motor skills and coordination, with finger taps and hand movements and foot taps and rapid alternating padding, she has overall mild difficulty, mild slowness noted.  She has on finger-to-nose testing no dysmetria or intention tremor and with heel-to-shin, no dysmetria. Sensory exam: intact to light touch in the upper and lower extremities.  Gait, station and balance: She stands Without difficulty, posture is slightly stooped with increase in thoracic kyphosis noted.  She has a history of scoliosis as well.  She walks slightly slowly with decreased stride length, slight decrease in arm swing bilaterally, right side more than left.  She turns without difficulty, no obvious shuffling.  No festination, no freezing.  Assessment and Plan:   In summary, KEN OTOOLE is a very pleasant 78 y.o.-year old female with an underlying medical  history of anxiety, depression, osteoporosis, scoliosis, status post back surgery, mitral valve prolapse, degenerative disc disease, fibromyalgia, hypertension, and hyperlipidemia, who Presents for evaluation of her tremors affecting both hands, sometimes she has involuntary jerking.  On examination, she has no myoclonus today, no athetoid movements, no dystonic movements, she does have a mild hand tremor, in addition, she does have very subtle signs of parkinsonian changes in that her tone is slightly increased in her upper extremities with slight cogwheeling noted, she has a decreased arm swing bilaterally. Her history is not suggestive of essential tremor.  It typically presents at a younger Larson and  her examination is not telltale for idiopathic Parkinson's disease.  She is largely reassured today.  Tremor triggers include dehydration, sleep deprivation, stress and anxiety.  Some medications can also exacerbate tremors.  In her particular case, it is possible that an SSRI is contributing to her tremor.  She believes that residual anxiety is playing a big role in her case which is very possible. 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 She is encouraged to talk to her psychiatry provider about optimization of her anxiety and depression medication and management.  If possible, she is encouraged to talk to them about coming off of the Zyprexa.  I did not suggest any tremor medication from my end of today for fear of side effects.  I would rather she work on her mood disorder and medication management.She is encouraged to improve her water intake as this may help.She has an otherwise nonfocal neurological exam, her history is not suggestive of a structural cause of her symptoms or stroke or TIA.  I suggested as needed follow-up with me.  I answered all their questions today and the patient and her daughter were in agreement.She is encouraged to follow-up with you for routine checkup and also  making sure her thyroid function is okay. Thank you very much for allowing me to participate in the care of this nice patient. If I can be of any further assistance to you please do not hesitate to call me at [phone].  Sincerely,   Jill Age, MD, PhD

## 2019-08-26 NOTE — Patient Instructions (Signed)
You have a mild Tremor of both hands, you have a mild degree of parkinsonism and that your muscle tone is a little elevated in your arms, he also have overall mild fine motor dyscontrol and you do not swing your arms as much when walking.  Your posture Is a little more stooped but this may be from having back problems. For your tremor, I would not recommend any new medication for fear of side effects. Medications can cause tremors including antidepressant medications and Zyprexa in particular can cause parkinsonism.  Please talk to your psychiatrist about alternative medication options.  I would be happy to see you back on an as-needed basis.  Please schedule your regular checkup with Heide Scales, nurse practitioner, and get blood work done to make sure your thyroid function is okay.  Please remember, that any kind of tremor may be exacerbated by anxiety, anger, nervousness, excitement, dehydration, sleep deprivation, by caffeine, and low blood sugar values or blood sugar fluctuations and thyroid disease.

## 2019-12-01 DIAGNOSIS — R479 Unspecified speech disturbances: Secondary | ICD-10-CM | POA: Insufficient documentation

## 2019-12-12 ENCOUNTER — Encounter (HOSPITAL_COMMUNITY): Payer: Self-pay

## 2019-12-12 ENCOUNTER — Inpatient Hospital Stay (HOSPITAL_COMMUNITY)
Admission: EM | Admit: 2019-12-12 | Discharge: 2019-12-17 | DRG: 917 | Disposition: A | Discharge status: Home Health | Payer: Medicare Other | Attending: Internal Medicine | Admitting: Internal Medicine

## 2019-12-12 ENCOUNTER — Emergency Department (HOSPITAL_COMMUNITY): Payer: Medicare Other

## 2019-12-12 DIAGNOSIS — Z79899 Other long term (current) drug therapy: Secondary | ICD-10-CM

## 2019-12-12 DIAGNOSIS — Z8249 Family history of ischemic heart disease and other diseases of the circulatory system: Secondary | ICD-10-CM

## 2019-12-12 DIAGNOSIS — G9081 Serotonin syndrome: Secondary | ICD-10-CM | POA: Diagnosis present

## 2019-12-12 DIAGNOSIS — R0600 Dyspnea, unspecified: Secondary | ICD-10-CM

## 2019-12-12 DIAGNOSIS — R197 Diarrhea, unspecified: Secondary | ICD-10-CM

## 2019-12-12 DIAGNOSIS — R4182 Altered mental status, unspecified: Secondary | ICD-10-CM | POA: Diagnosis present

## 2019-12-12 DIAGNOSIS — E7800 Pure hypercholesterolemia, unspecified: Secondary | ICD-10-CM | POA: Diagnosis present

## 2019-12-12 DIAGNOSIS — G2579 Other drug induced movement disorders: Secondary | ICD-10-CM | POA: Diagnosis present

## 2019-12-12 DIAGNOSIS — I341 Nonrheumatic mitral (valve) prolapse: Secondary | ICD-10-CM | POA: Diagnosis present

## 2019-12-12 DIAGNOSIS — F419 Anxiety disorder, unspecified: Secondary | ICD-10-CM | POA: Diagnosis present

## 2019-12-12 DIAGNOSIS — R4701 Aphasia: Secondary | ICD-10-CM | POA: Diagnosis present

## 2019-12-12 DIAGNOSIS — T424X1A Poisoning by benzodiazepines, accidental (unintentional), initial encounter: Principal | ICD-10-CM | POA: Diagnosis present

## 2019-12-12 DIAGNOSIS — R339 Retention of urine, unspecified: Secondary | ICD-10-CM | POA: Diagnosis not present

## 2019-12-12 DIAGNOSIS — E785 Hyperlipidemia, unspecified: Secondary | ICD-10-CM | POA: Diagnosis present

## 2019-12-12 DIAGNOSIS — G92 Toxic encephalopathy: Secondary | ICD-10-CM | POA: Diagnosis present

## 2019-12-12 DIAGNOSIS — M419 Scoliosis, unspecified: Secondary | ICD-10-CM | POA: Diagnosis present

## 2019-12-12 DIAGNOSIS — M797 Fibromyalgia: Secondary | ICD-10-CM | POA: Diagnosis present

## 2019-12-12 DIAGNOSIS — Z87891 Personal history of nicotine dependence: Secondary | ICD-10-CM

## 2019-12-12 DIAGNOSIS — M810 Age-related osteoporosis without current pathological fracture: Secondary | ICD-10-CM | POA: Diagnosis present

## 2019-12-12 DIAGNOSIS — U071 COVID-19: Secondary | ICD-10-CM | POA: Diagnosis present

## 2019-12-12 DIAGNOSIS — F329 Major depressive disorder, single episode, unspecified: Secondary | ICD-10-CM | POA: Diagnosis present

## 2019-12-12 DIAGNOSIS — G252 Other specified forms of tremor: Secondary | ICD-10-CM | POA: Diagnosis present

## 2019-12-12 DIAGNOSIS — I10 Essential (primary) hypertension: Secondary | ICD-10-CM | POA: Diagnosis present

## 2019-12-12 DIAGNOSIS — G9340 Encephalopathy, unspecified: Secondary | ICD-10-CM | POA: Diagnosis present

## 2019-12-12 DIAGNOSIS — Z800 Family history of malignant neoplasm of digestive organs: Secondary | ICD-10-CM

## 2019-12-12 LAB — CBC WITH DIFFERENTIAL/PLATELET
Abs Immature Granulocytes: 0.01 10*3/uL (ref 0.00–0.07)
Basophils Absolute: 0 10*3/uL (ref 0.0–0.1)
Basophils Relative: 1 %
Eosinophils Absolute: 0.3 10*3/uL (ref 0.0–0.5)
Eosinophils Relative: 6 %
HCT: 34 % — ABNORMAL LOW (ref 36.0–46.0)
Hemoglobin: 12.1 g/dL (ref 12.0–15.0)
Immature Granulocytes: 0 %
Lymphocytes Relative: 35 %
Lymphs Abs: 1.8 10*3/uL (ref 0.7–4.0)
MCH: 32.5 pg (ref 26.0–34.0)
MCHC: 35.6 g/dL (ref 30.0–36.0)
MCV: 91.4 fL (ref 80.0–100.0)
Monocytes Absolute: 0.6 10*3/uL (ref 0.1–1.0)
Monocytes Relative: 11 %
Neutro Abs: 2.4 10*3/uL (ref 1.7–7.7)
Neutrophils Relative %: 47 %
Platelets: 211 10*3/uL (ref 150–400)
RBC: 3.72 MIL/uL — ABNORMAL LOW (ref 3.87–5.11)
RDW: 12.7 % (ref 11.5–15.5)
WBC: 5.1 10*3/uL (ref 4.0–10.5)
nRBC: 0 % (ref 0.0–0.2)

## 2019-12-12 LAB — CBG MONITORING, ED: Glucose-Capillary: 71 mg/dL (ref 70–99)

## 2019-12-12 LAB — COMPREHENSIVE METABOLIC PANEL
ALT: 18 U/L (ref 0–44)
AST: 30 U/L (ref 15–41)
Albumin: 3.6 g/dL (ref 3.5–5.0)
Alkaline Phosphatase: 131 U/L — ABNORMAL HIGH (ref 38–126)
Anion gap: 12 (ref 5–15)
BUN: 22 mg/dL (ref 8–23)
CO2: 24 mmol/L (ref 22–32)
Calcium: 9 mg/dL (ref 8.9–10.3)
Chloride: 101 mmol/L (ref 98–111)
Creatinine, Ser: 1.1 mg/dL — ABNORMAL HIGH (ref 0.44–1.00)
GFR calc Af Amer: 56 mL/min — ABNORMAL LOW (ref >60)
GFR calc non Af Amer: 48 mL/min — ABNORMAL LOW (ref >60)
Glucose, Bld: 100 mg/dL — ABNORMAL HIGH (ref 70–99)
Potassium: 3.7 mmol/L (ref 3.5–5.1)
Sodium: 137 mmol/L (ref 135–145)
Total Bilirubin: 0.9 mg/dL (ref 0.3–1.2)
Total Protein: 7.1 g/dL (ref 6.5–8.1)

## 2019-12-12 MED ORDER — SODIUM CHLORIDE 0.9 % IV BOLUS
500.0000 mL | Freq: Once | INTRAVENOUS | Status: AC
  Administered 2019-12-12: 500 mL via INTRAVENOUS

## 2019-12-12 NOTE — ED Triage Notes (Signed)
Pt came in [facility] form home c/o of AMS report by family. Pt is usually A/Ox4 however, she is continued to decline in augmentation, as well as decreased appetite. Started new meds 3weeks ago however, the regimen has changed. Went to PCP couple days ago for UTI concerns but test were negative.

## 2019-12-12 NOTE — ED Provider Notes (Signed)
[facility] EMERGENCY DEPARTMENT Provider Note   CSN: [id number] Arrival date & time: 12/12/19  2228     History Chief Complaint  Patient presents with  . Altered Mental Status    Jill Larson is a 79 y.o. female.  79 year old female brought in by EMS from home for altered mental status.  Patient is unable to provide history, per EMS patient has had steady decline over the past 3 weeks, seen by PCP on Friday and had a negative urinalysis.  Family is questioning if mental status changes due to a medication change which occurred 3 weeks ago, unclear what medication this is.  Patient is alert to person only no other history available, family not available by phone call.        Past Medical History:  Diagnosis Date  . Anxiety   . Benign fundic gland polyps of stomach 2001  . DDD (degenerative disc disease)   . Depression   . Depression   . Fibromyalgia   . Hyperlipidemia   . Hypertension   . Mitral valve prolapse   . Osteoporosis   . Scoliosis     Patient Active Problem List   Diagnosis Date Noted  . Chronic LBP 03/11/2015  . Clinical depression 03/11/2015  . Intestinal obstruction due to decreased peristalsis ([place]) 03/11/2015  . Pulmonary embolism ([place]) 03/11/2015  . SPL (spondylolisthesis) 03/11/2015  . OTHER DISEASES OF LUNG NOT ELSEWHERE CLASSIFIED 10/30/2009  . GASTRIC POLYP 06/19/2008  . PALPITATIONS 06/14/2008  . HYPERCHOLESTEROLEMIA 04/18/2008  . ANXIETY 04/18/2008  . HYPERTENSION, BORDERLINE 04/18/2008  . CONSTIPATION 04/18/2008  . BACK PAIN, LUMBAR 04/18/2008  . 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 OSTEOPOROSIS 04/18/2008  . MITRAL VALVE PROLAPSE, HX OF 04/18/2008    Past Surgical History:  Procedure Laterality Date  . BACK SURGERY    . BREAST EXCISIONAL BIOPSY Right pt unsure  . BREAST SURGERY     right to remove calcium deposits  . TONSILLECTOMY AND ADENOIDECTOMY    . TUBAL LIGATION       OB History   No obstetric history on  file.     Family History  Problem Relation Age of Onset  . Hypertension Father   . Pancreatic cancer Sister   . Colon cancer Neg Hx     Social History   Tobacco Use  . Smoking status: Former Smoker    Types: Cigarettes  . Smokeless tobacco: Never Used  Substance Use Topics  . Alcohol use: Yes    Comment: Social  . Drug use: No    Home Medications Prior to Admission medications   Medication Sig Start Date End Date Taking? Authorizing Provider  ALPRAZolam Duanne Moron) 1 MG tablet 2 (two) times daily.  12/15/16   [provider]  b complex vitamins capsule Take 1 capsule by mouth daily.    [provider]  Cholecalciferol (VITAMIN D PO) Take 1 capsule by mouth daily.    [provider]  escitalopram (LEXAPRO) 20 MG tablet 10 mg.  12/02/16   [provider]  OLANZapine (ZYPREXA) 5 MG tablet  12/17/18   [provider]  Omega-3 Fatty Acids (FISH OIL PO) Take 1 capsule by mouth daily.    [provider]    Allergies    Patient has no known allergies.  Review of Systems   Review of Systems  Unable to perform ROS: Mental status change    Physical Exam Updated Vital Signs BP (!) 153/75 (BP Location: Left Arm)   Pulse  73   Temp 97.6 F (36.4 C) (Oral)   Resp 18   Ht 5\' 4"  (1.626 m)   Wt 54.4 kg   SpO2 100%   BMI 20.60 kg/m   Physical Exam Vitals and nursing note reviewed.  Constitutional:      General: She is not in acute distress.    Appearance: She is well-developed. She is not diaphoretic.  HENT:     Head: Normocephalic and atraumatic.  Cardiovascular:     Rate and Rhythm: Normal rate and regular rhythm.     Pulses: Normal pulses.     Heart sounds: Normal heart sounds.  Pulmonary:     Effort: Pulmonary effort is normal.     Breath sounds: Normal breath sounds.  Abdominal:     Palpations: Abdomen is soft.     Tenderness: There is no abdominal tenderness.  Musculoskeletal:     Right lower leg: No edema.      Left lower leg: No edema.  Skin:    General: Skin is warm and dry.     Findings: No erythema or rash.  Neurological:     Mental Status: She is alert.     GCS: GCS eye subscore is 4. GCS verbal subscore is 3. GCS motor subscore is 6.     Comments: Alert to person, unable to answer date of birth or any other questions, speaks in random number sequences at times. Follows basic commands.   Psychiatric:        Behavior: Behavior normal.     ED Results / Procedures / Treatments   Labs (all labs ordered are listed, but only abnormal results are displayed) Labs Reviewed  CBC WITH DIFFERENTIAL/PLATELET - Abnormal; Notable for the following components:      Result Value   RBC 3.72 (*)    HCT 34.0 (*)    All other components within normal limits  COMPREHENSIVE METABOLIC PANEL - Abnormal; Notable for the following components:   Glucose, Bld 100 (*)    Creatinine, Ser 1.10 (*)    Alkaline Phosphatase 131 (*)    GFR calc non Af Amer 48 (*)    GFR calc Af Amer 56 (*)    All other components within normal limits  URINALYSIS, ROUTINE W REFLEX MICROSCOPIC  AMMONIA  CBG MONITORING, ED    EKG None  Radiology CT Head Wo Contrast  Result Date: 12/12/2019 CLINICAL DATA:  Altered mental status.  Decreased appetite. EXAM: CT HEAD WITHOUT CONTRAST TECHNIQUE: Contiguous axial images were obtained from the base of the skull through the vertex without intravenous contrast. COMPARISON:  12/03/2019 FINDINGS: Brain: No apparent change. Chronic small-vessel ischemic changes of the cerebral hemispheric white matter. No sign of acute infarction, mass lesion, hemorrhage, hydrocephalus or extra-axial collection. Vascular: There is atherosclerotic calcification of the major vessels at the base of the brain. Skull: Negative Sinuses/Orbits: Clear/normal Other: None IMPRESSION: No acute finding. Chronic small-vessel ischemic changes of the white matter, similar to the previous examination Electronically Signed   By:  Nelson Chimes M.D.   On: 12/12/2019 23:10    Procedures Procedures (including critical care time)  Medications Ordered in ED Medications  sodium chloride 0.9 % bolus 500 mL (500 mLs Intravenous New Bag/Given 12/12/19 2309)    ED Course  I have reviewed the triage vital signs and the nursing notes.  Pertinent labs & imaging results that were available during my care of the patient were reviewed by me and considered in my medical decision making (see  chart for details).  Clinical Course as of Feb 22 0001  Sun Dec 12, 2019  2254 No recent records available in care everywhere or chart review. 79 year old female brought in by EMS from home for altered mental status with decline over the past 3 weeks.  Prior to this time patient was reportedly alert and oriented x4 and able to care for self, at this time is not able to perform ADLs, is not eating or drinking. On exam, patient is awake, alert to name only, follows basic commands. Attempts to call patient's daughter with number listed in chart unsuccessful.  Will order labs and imaging as patient is unable to provide any information tonight.    [LM]  [place] signed out to [employer], PA-C, pending UA. If UA normal, patient may need PT/OT eval for placement.    [LM]    Clinical Course User Index [LM] Roque Lias   MDM Rules/Calculators/A&P                      Final Clinical Impression(s) / ED Diagnoses Final diagnoses:  Altered mental status, unspecified altered mental status type    Rx / DC Orders ED Discharge Orders    None       Roque Lias [id number]    Delora Fuel, MD [id number] [phone]

## 2019-12-13 ENCOUNTER — Observation Stay (HOSPITAL_COMMUNITY): Payer: Medicare Other

## 2019-12-13 ENCOUNTER — Emergency Department (HOSPITAL_COMMUNITY): Payer: Medicare Other

## 2019-12-13 DIAGNOSIS — I341 Nonrheumatic mitral (valve) prolapse: Secondary | ICD-10-CM | POA: Diagnosis present

## 2019-12-13 DIAGNOSIS — M419 Scoliosis, unspecified: Secondary | ICD-10-CM | POA: Diagnosis present

## 2019-12-13 DIAGNOSIS — G9340 Encephalopathy, unspecified: Secondary | ICD-10-CM

## 2019-12-13 DIAGNOSIS — G9081 Serotonin syndrome: Secondary | ICD-10-CM

## 2019-12-13 DIAGNOSIS — F419 Anxiety disorder, unspecified: Secondary | ICD-10-CM | POA: Diagnosis present

## 2019-12-13 DIAGNOSIS — R4701 Aphasia: Secondary | ICD-10-CM | POA: Diagnosis present

## 2019-12-13 DIAGNOSIS — E785 Hyperlipidemia, unspecified: Secondary | ICD-10-CM | POA: Diagnosis present

## 2019-12-13 DIAGNOSIS — Z79899 Other long term (current) drug therapy: Secondary | ICD-10-CM | POA: Diagnosis not present

## 2019-12-13 DIAGNOSIS — Z8249 Family history of ischemic heart disease and other diseases of the circulatory system: Secondary | ICD-10-CM | POA: Diagnosis not present

## 2019-12-13 DIAGNOSIS — M797 Fibromyalgia: Secondary | ICD-10-CM | POA: Diagnosis present

## 2019-12-13 DIAGNOSIS — G2579 Other drug induced movement disorders: Secondary | ICD-10-CM | POA: Diagnosis present

## 2019-12-13 DIAGNOSIS — E7800 Pure hypercholesterolemia, unspecified: Secondary | ICD-10-CM | POA: Diagnosis present

## 2019-12-13 DIAGNOSIS — M810 Age-related osteoporosis without current pathological fracture: Secondary | ICD-10-CM | POA: Diagnosis present

## 2019-12-13 DIAGNOSIS — I10 Essential (primary) hypertension: Secondary | ICD-10-CM | POA: Diagnosis present

## 2019-12-13 DIAGNOSIS — F329 Major depressive disorder, single episode, unspecified: Secondary | ICD-10-CM | POA: Diagnosis present

## 2019-12-13 DIAGNOSIS — Z87891 Personal history of nicotine dependence: Secondary | ICD-10-CM | POA: Diagnosis not present

## 2019-12-13 DIAGNOSIS — Z800 Family history of malignant neoplasm of digestive organs: Secondary | ICD-10-CM | POA: Diagnosis not present

## 2019-12-13 DIAGNOSIS — R339 Retention of urine, unspecified: Secondary | ICD-10-CM | POA: Diagnosis not present

## 2019-12-13 DIAGNOSIS — G252 Other specified forms of tremor: Secondary | ICD-10-CM | POA: Diagnosis present

## 2019-12-13 DIAGNOSIS — R0600 Dyspnea, unspecified: Secondary | ICD-10-CM | POA: Diagnosis not present

## 2019-12-13 DIAGNOSIS — T424X1A Poisoning by benzodiazepines, accidental (unintentional), initial encounter: Secondary | ICD-10-CM | POA: Diagnosis present

## 2019-12-13 DIAGNOSIS — R4182 Altered mental status, unspecified: Secondary | ICD-10-CM

## 2019-12-13 DIAGNOSIS — G92 Toxic encephalopathy: Secondary | ICD-10-CM | POA: Diagnosis present

## 2019-12-13 DIAGNOSIS — U071 COVID-19: Secondary | ICD-10-CM | POA: Diagnosis present

## 2019-12-13 HISTORY — DX: Other drug induced movement disorders: G25.79

## 2019-12-13 HISTORY — DX: Encephalopathy, unspecified: G93.40

## 2019-12-13 HISTORY — DX: Altered mental status, unspecified: R41.82

## 2019-12-13 HISTORY — DX: Serotonin syndrome: G90.81

## 2019-12-13 LAB — URINALYSIS, ROUTINE W REFLEX MICROSCOPIC
Bilirubin Urine: NEGATIVE
Glucose, UA: NEGATIVE mg/dL
Hgb urine dipstick: NEGATIVE
Ketones, ur: 15 mg/dL — AB
Leukocytes,Ua: NEGATIVE
Nitrite: NEGATIVE
Protein, ur: NEGATIVE mg/dL
Specific Gravity, Urine: 1.01 (ref 1.005–1.030)
pH: 6 (ref 5.0–8.0)

## 2019-12-13 LAB — AMMONIA: Ammonia: <9 umol/L — ABNORMAL LOW (ref 9–35)

## 2019-12-13 LAB — PROCALCITONIN: Procalcitonin: <0.1 ng/mL

## 2019-12-13 LAB — SARS CORONAVIRUS 2 (TAT 6-24 HRS): SARS Coronavirus 2: POSITIVE — AB

## 2019-12-13 MED ORDER — ONDANSETRON HCL 4 MG PO TABS: 4.0000 mg | ORAL_TABLET | Freq: Four times a day (QID) | ORAL | Status: DC | PRN

## 2019-12-13 MED ORDER — CHLORHEXIDINE GLUCONATE CLOTH 2 % EX PADS
6.0000 | MEDICATED_PAD | Freq: Every day | CUTANEOUS | Status: DC
  Administered 2019-12-13 – 2019-12-15 (×3): 6 via TOPICAL

## 2019-12-13 MED ORDER — LORAZEPAM 2 MG/ML IJ SOLN
2.0000 mg | Freq: Four times a day (QID) | INTRAMUSCULAR | Status: DC
  Administered 2019-12-13 (×2): 2 mg via INTRAVENOUS
  Filled 2019-12-13 (×2): qty 1

## 2019-12-13 MED ORDER — LORAZEPAM 2 MG/ML IJ SOLN
1.0000 mg | Freq: Three times a day (TID) | INTRAMUSCULAR | Status: DC
  Administered 2019-12-13 – 2019-12-14 (×2): 1 mg via INTRAVENOUS
  Filled 2019-12-13 (×3): qty 1

## 2019-12-13 MED ORDER — ACETAMINOPHEN 325 MG PO TABS: 650.0000 mg | ORAL_TABLET | Freq: Four times a day (QID) | ORAL | Status: DC | PRN

## 2019-12-13 MED ORDER — ONDANSETRON HCL 4 MG/2ML IJ SOLN: 4.0000 mg | Freq: Four times a day (QID) | INTRAMUSCULAR | Status: DC | PRN

## 2019-12-13 MED ORDER — ALBUTEROL SULFATE HFA 108 (90 BASE) MCG/ACT IN AERS
2.0000 | INHALATION_SPRAY | Freq: Four times a day (QID) | RESPIRATORY_TRACT | Status: DC | PRN
  Filled 2019-12-13: qty 6.7

## 2019-12-13 MED ORDER — LACTATED RINGERS IV SOLN: INTRAVENOUS | Status: AC

## 2019-12-13 MED ORDER — TAMSULOSIN HCL 0.4 MG PO CAPS
0.4000 mg | ORAL_CAPSULE | Freq: Every day | ORAL | Status: DC
  Administered 2019-12-13 – 2019-12-17 (×5): 0.4 mg via ORAL
  Filled 2019-12-13 (×5): qty 1

## 2019-12-13 MED ORDER — ENOXAPARIN SODIUM 40 MG/0.4ML ~~LOC~~ SOLN
40.0000 mg | SUBCUTANEOUS | Status: DC
  Administered 2019-12-13: 40 mg via SUBCUTANEOUS
  Filled 2019-12-13: qty 0.4

## 2019-12-13 MED ORDER — ACETAMINOPHEN 650 MG RE SUPP
650.0000 mg | Freq: Four times a day (QID) | RECTAL | Status: DC | PRN
  Administered 2019-12-14: 650 mg via RECTAL
  Filled 2019-12-13: qty 1

## 2019-12-13 NOTE — Consult Note (Addendum)
History and Physical    Jill Larson [id number] DOB: 1940/11/26 DOA: 12/12/2019  PCP: Imagene Riches, NP  Patient coming from: Home  I have personally briefly reviewed patient's old medical records in [place]  Chief Complaint: AMS  HPI: Jill Larson is a 79 y.o. female with medical history significant of depression, HTN.  Presents to ED with several weeks to months history of progressive decline per daughter.  Increased fatigue and sleeping.  Daughter suspects depression meds may be contributing.  Patient currently taking: Propranolol 10mg  BID Lexapro 20mg  once daily Venlafaxine 37.5 once at night Benztropine 1mg  BID zyprexa 5mg  once a day Xanax 1mg  BID PRN (last took last PM)  Last change was ~3-4 weeks ago.  Last thurs: PCP said "iron was low" and vit d got increased.  ED Course: In the ED, patient is most certainly not fatigued or sleeping.  She is awake, alert (hypervigilant if anything), cannot participate meaningfully in history.  Can follow some commands, but when asked questions will start muttering and answering nonsensically.   Review of Systems: Pt unable to contribute meaningfully to history.  Past Medical History:  Diagnosis Date  . Anxiety   . Benign fundic gland polyps of stomach 2001  . DDD (degenerative disc disease)   . Depression   . Depression   . Fibromyalgia   . Hyperlipidemia   . Hypertension   . Mitral valve prolapse   . Osteoporosis   . Scoliosis     Past Surgical History:  Procedure Laterality Date  . BACK SURGERY    . BREAST EXCISIONAL BIOPSY Right pt unsure  . BREAST SURGERY     right to remove calcium deposits  . TONSILLECTOMY AND ADENOIDECTOMY    . TUBAL LIGATION       reports that she has quit smoking. Her smoking use included cigarettes. She has never used smokeless tobacco. She reports current alcohol use. She reports that she does not use drugs.  No Known Allergies  Family History  Problem Relation  Age of Onset  . Hypertension Father   . Pancreatic cancer Sister   . Colon cancer Neg Hx      Prior to Admission medications   Medication Sig Start Date End Date Taking? Authorizing Provider  ALPRAZolam Duanne Moron) 1 MG tablet 2 (two) times daily.  12/15/16   [provider]  b complex vitamins capsule Take 1 capsule by mouth daily.    [provider]  Cholecalciferol (VITAMIN D PO) Take 1 capsule by mouth daily.    [provider]  escitalopram (LEXAPRO) 20 MG tablet 10 mg.  12/02/16   [provider]  OLANZapine (ZYPREXA) 5 MG tablet  12/17/18   [provider]  Omega-3 Fatty Acids (FISH OIL PO) Take 1 capsule by mouth daily.    [provider]    Physical Exam: Vitals:   12/12/19 2345 12/13/19 0000 12/13/19 0015 12/13/19 0100  BP: (!) 161/74 (!) 149/89 (!) 163/86 (!) 155/79  Pulse: 82 81 87 82  Resp: (!) 26 (!) 24 (!) 23 (!) 25  Temp:      TempSrc:      SpO2: 100% 100% 99% 100%  Weight:      Height:        Constitutional: NAD, calm, comfortable Eyes: PERRL, lids and conjunctivae normal ENMT: Mucous membranes are moist. Posterior pharynx clear of any exudate or lesions.Normal dentition.  Neck: normal, supple, no masses, no thyromegaly Respiratory: clear to auscultation bilaterally,  no wheezing, no crackles. Normal respiratory effort. No accessory muscle use.  Cardiovascular: Regular rate and rhythm, no murmurs / rubs / gallops. No extremity edema. 2+ pedal pulses. No carotid bruits.  Abdomen: no tenderness, no masses palpated. No hepatosplenomegaly. Bowel sounds positive.  Musculoskeletal: no clubbing / cyanosis. No joint deformity upper and lower extremities. Good ROM, no contractures. Normal muscle tone.  Skin: no rashes, lesions, ulcers. No induration Neurologic:  DTR slightly hyper reflexic with some clonus. MAE, no obvious gross deficit. Psychiatric: Awake, alert, oriented to self, answers questions nonsensically.   Labs  on Admission: I have personally reviewed following labs and imaging studies  CBC: Recent Labs  Lab 12/12/19 2250  WBC 5.1  NEUTROABS 2.4  HGB 12.1  HCT 34.0*  MCV 91.4  PLT 123456   Basic Metabolic Panel: Recent Labs  Lab 12/12/19 2250  NA 137  K 3.7  CL 101  CO2 24  GLUCOSE 100*  BUN 22  CREATININE 1.10*  CALCIUM 9.0   GFR: Estimated Creatinine Clearance: 36.8 mL/min (A) (by C-G formula based on SCr of 1.1 mg/dL (H)). Liver Function Tests: Recent Labs  Lab 12/12/19 2250  AST 30  ALT 18  ALKPHOS 131*  BILITOT 0.9  PROT 7.1  ALBUMIN 3.6   No results for input(s): LIPASE, AMYLASE in the last 168 hours. Recent Labs  Lab 12/12/19 2250  AMMONIA <9*   Coagulation Profile: No results for input(s): INR, PROTIME in the last 168 hours. Cardiac Enzymes: No results for input(s): CKTOTAL, CKMB, CKMBINDEX, TROPONINI in the last 168 hours. BNP (last 3 results) No results for input(s): PROBNP in the last 8760 hours. HbA1C: No results for input(s): HGBA1C in the last 72 hours. CBG: Recent Labs  Lab 12/12/19 2318  GLUCAP 71   Lipid Profile: No results for input(s): CHOL, HDL, LDLCALC, TRIG, CHOLHDL, LDLDIRECT in the last 72 hours. Thyroid Function Tests: No results for input(s): TSH, T4TOTAL, FREET4, T3FREE, THYROIDAB in the last 72 hours. Anemia Panel: No results for input(s): VITAMINB12, FOLATE, FERRITIN, TIBC, IRON, RETICCTPCT in the last 72 hours. Urine analysis:    Component Value Date/Time   COLORURINE YELLOW 12/12/2019 2250   APPEARANCEUR CLEAR 12/12/2019 2250   LABSPEC 1.010 12/12/2019 2250   PHURINE 6.0 12/12/2019 2250   GLUCOSEU NEGATIVE 12/12/2019 2250   GLUCOSEU NEGATIVE 11/14/2010 1155   HGBUR NEGATIVE 12/12/2019 2250   BILIRUBINUR NEGATIVE 12/12/2019 2250   KETONESUR 15 (A) 12/12/2019 2250   PROTEINUR NEGATIVE 12/12/2019 2250   UROBILINOGEN 0.2 11/14/2010 1155   NITRITE NEGATIVE 12/12/2019 2250   LEUKOCYTESUR NEGATIVE 12/12/2019 2250     Radiological Exams on Admission: CT Head Wo Contrast  Result Date: 12/12/2019 CLINICAL DATA:  Altered mental status.  Decreased appetite. EXAM: CT HEAD WITHOUT CONTRAST TECHNIQUE: Contiguous axial images were obtained from the base of the skull through the vertex without intravenous contrast. COMPARISON:  12/03/2019 FINDINGS: Brain: No apparent change. Chronic small-vessel ischemic changes of the cerebral hemispheric white matter. No sign of acute infarction, mass lesion, hemorrhage, hydrocephalus or extra-axial collection. Vascular: There is atherosclerotic calcification of the major vessels at the base of the brain. Skull: Negative Sinuses/Orbits: Clear/normal Other: None IMPRESSION: No acute finding. Chronic small-vessel ischemic changes of the white matter, similar to the previous examination Electronically Signed   By: Nelson Chimes M.D.   On: 12/12/2019 23:10   DG CHEST PORT 1 VIEW  Result Date: 12/13/2019 CLINICAL DATA:  Altered level of consciousness EXAM: PORTABLE CHEST 1 VIEW COMPARISON:  11/01/2010  FINDINGS: Single frontal view of the chest demonstrates an unremarkable cardiac silhouette. No airspace disease, effusion, or pneumothorax. Minimal scarring at the lung bases. No acute bony abnormalities. IMPRESSION: 1. No acute intrathoracic process. Electronically Signed   By: Randa Ngo M.D.   On: 12/13/2019 02:22    EKG: Independently reviewed.  Assessment/Plan Principal Problem:   Altered mental status    1. AMS - 1. Most likely DDx includes: medication induced AMS, neuroleptic malignant syndrome or serotonin syndrome, vs psychiatric cause. 2. Neuro seeing patient to try and help differentiate / rule out NMS and/or serotonin syndrome. 3. Though presentation seems quite subacute to chronic for either of these (most cases of serotonin syndrome for example occurring within 24H of starting a new med), this patient has been having progressive decline for weeks to months and last med  change was at least 2-3 weeks ago. 4. The tremor and hyper-reflexia also appears to be chronic, she saw neurology (see [facility] note on 11/5) for this back in November.  5. Remainder of medical work up looks negative thus far. 1. Ammonia nl 2. CT head nl 3. CXR neg 4. UA neg 5. CBC/BMP unimpressive 6. Checking TSH 7. Will defer any need for MRI brain to neurology (though again, history seems quite subacute / chronic for a stroke not to be showing up on CT scan today). 8. Will either admit for observation if neurology thinks that it could be NMS or serotonin syndrome. 1. In which case hold all psych meds except Benzos (Xanax). 9. Or alternatively if neurology doesn't think its NMS nor serotonin syndrome, then will recommend psychiatry consult in ED to see if they think this is more up their ally.   DVT prophylaxis: Lovenox if admitted Code Status: Full Family Communication: Spoke with daughter on phone, [phone] is number Disposition Plan: TBD Consults called: Neurology Admission status: Obs vs psychiatry, TBD by neurology   Etta Quill DO [facility]  How to contact the [facility] Attending or Consulting provider [place] or covering provider during after hours [place], for this patient?  1. Check the care team in [facility] and look for a) attending/consulting [facility] provider listed and b) the [facility] team listed 2. Log into [email or web address]  Amion Physician Scheduling and messaging for groups and whole hospitals  On call and physician scheduling software for group practices, residents, hospitalists and other medical providers for call, clinic, rotation and shift schedules. On[employer] is a hospital-wide system for scheduling doctors and paging doctors on call. EasyPlot is for scientific plotting and data analysis.  [email or web address]  and use [place]'s universal password to access. If you do not have the password, please contact the hospital operator.  3. Locate the [facility] provider you are  looking for under [facility] and page to a number that you can be directly reached. 4. If you still have difficulty reaching the provider, please page the [facility] (Director on Call) for the Hospitalists listed on amion for assistance.  12/13/2019, 2:29 AM

## 2019-12-13 NOTE — Consult Note (Signed)
NEURO HOSPITALIST CONSULT NOTE   Requestig physician: Dr. Alcario Drought  Reason for Consult: Delirium  History obtained from:   Chart     HPI:                                                                                                                                          Jill Larson is an 79 y.o. female with a history of anxiety and depression, presenting to the ED with > 2 week history of progressive cognitive decline, fatigue and somnolence, per daughter. The daughter suspects that the patient's depression medications may be contributing. Per notes, she was also seen for tremor and cognitive changes last November. Per Dr. Juleen China interview with the daughter, her last medication change was about 3-4 weeks ago.   Home medications: Propranolol 10mg  BID Lexapro 20 mg once daily Venlafaxine 37.5 once at night Benztropine 1mg  BID Zyprexa 5 mg once a day Xanax 1mg  BID PRN (last took last PM)   Past Medical History:  Diagnosis Date  . Anxiety   . Benign fundic gland polyps of stomach 2001  . DDD (degenerative disc disease)   . Depression   . Depression   . Fibromyalgia   . Hyperlipidemia   . Hypertension   . Mitral valve prolapse   . Osteoporosis   . Scoliosis     Past Surgical History:  Procedure Laterality Date  . BACK SURGERY    . BREAST EXCISIONAL BIOPSY Right pt unsure  . BREAST SURGERY     right to remove calcium deposits  . TONSILLECTOMY AND ADENOIDECTOMY    . TUBAL LIGATION      Family History  Problem Relation Age of Onset  . Hypertension Father   . Pancreatic cancer Sister   . Colon cancer Neg Hx               Social History:  reports that she has quit smoking. Her smoking use included cigarettes. She has never used smokeless tobacco. She reports current alcohol use. She reports that she does not use drugs.  No Known Allergies    ROS:  Unable to obtain due to AMS.    Blood pressure (!) 169/82, pulse 80, temperature 97.6 F (36.4 C), temperature source Oral, resp. rate 16, height 5\' 4"  (1.626 m), weight 54.4 kg, SpO2 100 %.   General Examination:                                                                                                       Physical Exam  HEENT-  [place]/AT    Lungs- Respirations unlabored Extremities- No edema   Neurological Examination Mental Status: Awake with dense receptive aphasia. Most verbal output is fluent but incomprehensible, with phonemic paraphasias, dysarthria and no intelligible expression of ideas. Speech is hypophonic but slightly pressured. Poor eye contact. Has difficulty following commands. Perseverates at times. Appears to be in a state of agitated delirium. Unable to answer orientation questions or provide a history. Does not attend when asked to name objects.  Cranial Nerves: II: Intermittently looks at examiner's face. Unable to test visual fields due to inattentiveness. Pupils 5 mm >> 3 mm bilaterally. III,IV, VI: Will gaze to left and right with saccades. Does not follow commands for testing of visual pursuits.  V,VII: Face symmetric, reacts to tactile stimulation VIII: hearing intact to voice IX,X: Mildly hypophonic speech XI: Head is midline XII: Unable to assess Motor: Moves upper and lower extremities with some resistance to examiner - no asymmetry noted. Tends to keep legs flexed at hips and knees. Increased tone x 4.  Sensory: Reacts to pinch x 4.  Deep Tendon Reflexes: 3+ bilateral brachioradialis. 4+ patellae bilaterally with clonus.  Plantars: Equivocal bilaterally  Cerebellar: Diffuse coarse tremor in upper and lower extremities symmetrically, worse with movement and stimulation. Unable to assess FNF or H-S.   Gait: Unable to assess due to AMS and falls risk concerns   Lab Results: Basic Metabolic Panel: Recent Labs   Lab 12/12/19 2250  NA 137  K 3.7  CL 101  CO2 24  GLUCOSE 100*  BUN 22  CREATININE 1.10*  CALCIUM 9.0    CBC: Recent Labs  Lab 12/12/19 2250  WBC 5.1  NEUTROABS 2.4  HGB 12.1  HCT 34.0*  MCV 91.4  PLT 211    Cardiac Enzymes: No results for input(s): CKTOTAL, CKMB, CKMBINDEX, TROPONINI in the last 168 hours.  Lipid Panel: No results for input(s): CHOL, TRIG, HDL, CHOLHDL, VLDL, LDLCALC in the last 168 hours.  Imaging: CT Head Wo Contrast  Result Date: 12/12/2019 CLINICAL DATA:  Altered mental status.  Decreased appetite. EXAM: CT HEAD WITHOUT CONTRAST TECHNIQUE: Contiguous axial images were obtained from the base of the skull through the vertex without intravenous contrast. COMPARISON:  12/03/2019 FINDINGS: Brain: No apparent change. Chronic small-vessel ischemic changes of the cerebral hemispheric white matter. No sign of acute infarction, mass lesion, hemorrhage, hydrocephalus or extra-axial collection. Vascular: There is atherosclerotic calcification of the major vessels at the base of the brain. Skull: Negative Sinuses/Orbits: Clear/normal Other: None IMPRESSION: No acute finding. Chronic small-vessel ischemic changes of the white matter, similar to the previous examination Electronically Signed   By: Elta Guadeloupe  Shogry M.D.   On: 12/12/2019 23:10   DG CHEST PORT 1 VIEW  Result Date: 12/13/2019 CLINICAL DATA:  Altered level of consciousness EXAM: PORTABLE CHEST 1 VIEW COMPARISON:  11/01/2010 FINDINGS: Single frontal view of the chest demonstrates an unremarkable cardiac silhouette. No airspace disease, effusion, or pneumothorax. Minimal scarring at the lung bases. No acute bony abnormalities. IMPRESSION: 1. No acute intrathoracic process. Electronically Signed   By: Randa Ngo M.D.   On: 12/13/2019 02:22     Assessment: 79 year old female with 2 week history of worsening AMS.  1. Exam reveals receptive aphasia, diffuse coarse tremor in all 4 limbs and hyperreflexia.  Overall findings are most consistent with a diffuse encephalopathy - best classifiable as an agitated delirium. DDx includes toxic encephalopathy secondary to medications, benzo withdrawal and metabolic encephalopathy. Subclinical seizures with postictal state is also a consideration. Given documented encephalopathic features since November, paraneoplastic/autoimmune encephalopathy is also possible, but felt to be less likely.  2. On 2 medications which could precipitate AMS with an agitated delirium: Lexapro (could precipitate a serotonin syndrome), Xanax (sedation) and olanzapine (sedation or neuroleptic malignant syndrome).  3. CT head shows no acute finding. Chronic small-vessel ischemic changes of the white matter are similar to the previous examination. 4. Ammonia level < 9 5. Aphasia on exam may be secondary to left perisylvian stroke. This would be at least 1 weeks old if present, given her history. Not a tPA or interventional candidate.    Recommendations: 1. MRI brain to rule out stroke (ordered). 2. Hold Lexapro, venlafaxine and Zyprexa.  3. Psychiatry consult 4. EEG (ordered) 5. Continue scheduled benzo for possible withdrawal: 2 mg IV q6h x 1 day, then 1 mg IV q6h x 2 days, then PRN with CIWA protocol.   Electronically signed: Dr. Kerney Elbe 12/13/2019, 2:39 AM

## 2019-12-13 NOTE — Progress Notes (Signed)
Patients daughter [name] was notify of her mothers condition and positive covid results.

## 2019-12-13 NOTE — ED Notes (Signed)
Kim in Lab to add TSH on.

## 2019-12-13 NOTE — ED Notes (Signed)
Pt continues to have expressive aphasia and is alert to self. Pt follows commands and answers yes/no questions. Pt repositioned, linens changed, and warm blankets given. Pt is resting comfortably at this time.

## 2019-12-13 NOTE — ED Notes (Signed)
Patient daughter calling asking for an update please call [name] [phone]

## 2019-12-13 NOTE — Progress Notes (Signed)
Dr Cheral Marker indicates he thinks acute encephalopathy is likely due to medications contributing, does recommend admission:  1) admit to obs 2) tele monitor 3) per Dr. Yvetta Coder recomendations:  1) Ativan 2mg  IV Q6H scheduled since shes on chronic Xanax  2) Hold antipsychotics  3) hold SSRIs  4) see his consult note for further details

## 2019-12-13 NOTE — Plan of Care (Signed)
  Problem: Clinical Measurements: Goal: Ability to maintain clinical measurements within normal limits will improve Outcome: Progressing Goal: Will remain free from infection Outcome: Progressing   Problem: Education: Goal: Knowledge of General Education information will improve Description: Including pain rating scale, medication(s)/side effects and non-pharmacologic comfort measures Outcome: Not Progressing   Problem: Health Behavior/Discharge Planning: Goal: Ability to manage health-related needs will improve Outcome: Not Progressing   

## 2019-12-13 NOTE — ED Provider Notes (Signed)
Patient is reportedly confused from baseline.  She cannot participate meaningfully in her history.  She can follow some commands, but when asked questions will start muttering nonsensically. She is able to state her name.    Numerous attempts to reach family have failed.  Busy signal on every attempt.    Seen with Dr. Roxanne Mins, who recommends admission for confusion and may need MRI.  Labs are fairly unremarkable.    CBG 71 No leukocytosis Electrolytes are normal Ammonia <9 CT head negative   2:12 AM Discussed with Dr. Alcario Drought, who will come to evaluate the patient.  Agrees with MRI and recommends adding CXR.  If these tests are negative recommends Geri-Psych eval in the AM.  Unclear on what, if any, medications were recently changed.  Appreciate Dr. Alcario Drought for consultation.   2:27 AM Patient seen by and discussed with Dr. Alcario Drought.  We were able to contact family at a different number.  The patient is taking Venlafaxine, Cogentin, Propranolol, Zyprexa, and Lexapro.  Dr. Alcario Drought recommends consultation with neurology regarding possible serotonin syndrome.  She does seem to be hyperreflexic.  She is not hyperthermic.  She does have some tremor, but she was noted to have tremor last year and was evaluated for Parkinsons, which was negative.  Appreciate Dr. Cheral Marker, neurology, for consulting.   6:13 AM Dr. Alcario Drought informs me that the plan is for admission.     Montine Circle, PA-C [id number]    Delora Fuel, MD [id number] [phone]

## 2019-12-13 NOTE — ED Notes (Signed)
Spoke to pt Daughter Santiago Glad [phone]

## 2019-12-13 NOTE — ED Notes (Signed)
Pt resting comfortably, pt still alert only to self.

## 2019-12-13 NOTE — Progress Notes (Signed)
EEG Complete. Results pending. 

## 2019-12-13 NOTE — ED Notes (Signed)
Pt daughter, Santiago Glad, notified of pt current status.

## 2019-12-13 NOTE — Progress Notes (Deleted)
EEG complete. Results pending.  ?

## 2019-12-13 NOTE — Progress Notes (Signed)
PROGRESS NOTE                                                                                                                                                                                                             Patient Demographics:    Jill Larson, is a 79 y.o. female, DOB - 1940/12/03, [id number]  Outpatient Primary MD for the patient is Imagene Riches, NP    LOS - 0  Admit date - 12/12/2019    Chief Complaint  Patient presents with  . Altered Mental Status       Brief Narrative 79 year old frail elderly Caucasian female with history of depression and hypertension was brought in by her daughter for evaluation of progressive mental decline with excessive fatigue, sleepiness and mild confusion.  In the ER she was found to have mild tremors, confusion, CT head was nonacute, she was seen by neurology and hospitalist team was requested to admit for possible serotonin syndrome.   Subjective:    Jill Larson today in bed is quite confused but denies any headache chest or abdominal pain, says she has to use the bathroom.  Does not know where she is.   Assessment  & Plan :     1. Acute Covid 19 Viral infection - no evidence of pulmonary involvement, will monitor clinically along with inflammatory markers.  Will hold remdesivir and steroids for now.  Encouraged the patient to sit up in chair in the daytime use I-S and flutter valve for pulmonary toiletry and then prone in bed when at night.     SpO2: 95 %  Recent Labs  Lab 12/13/19 0232  SARSCOV2NAA POSITIVE*      2.  Metabolic encephalopathy most likely serotonin syndrome.  Hold offending medications, IV fluids, benzodiazepines, no focal deficits, CT head and EEG nonacute, MRI pending although I do not think it is necessary.  Psych and neuro consulted.  3.  Hypertension.  Stable as needed IV hydralazine will be ordered.  4.  Acute urinary retention.   Could be due to #1 above, Foley Flomax and monitor.    Condition - Extremely Guarded  Family Communication  : Called daughter on her listed phone number on 12/13/2019 at 3:33 PM.  No answer.  Code Status : Full  Diet :   Diet Order  Diet Heart Room service appropriate? Yes; Fluid consistency: Thin  Diet effective now               Disposition Plan  : Stay in the hospital, getting treatment for metabolic encephalopathy, serotonin syndrome  Consults  : Neurology, psychiatry  Procedures  :    CT head.  Nonacute  EEG.  Nonspecific changes no seizure focus.  RI brain.  Pending  PUD Prophylaxis : None  DVT Prophylaxis  :  Lovenox   Lab Results  Component Value Date   PLT 211 12/12/2019    Inpatient Medications  Scheduled Meds: . enoxaparin (LOVENOX) injection  40 mg Subcutaneous Q24H  . LORazepam  2 mg Intravenous Q6H   Continuous Infusions: . lactated ringers     PRN Meds:.acetaminophen **OR** acetaminophen, albuterol, ondansetron **OR** ondansetron (ZOFRAN) IV  Antibiotics  :    Anti-infectives (From admission, onward)   None       Time Spent in minutes  30   Jill Larson M.D on 12/13/2019 at 3:28 PM  To page go to [email or web address] - password [facility]  [facility] -  Office  [phone]     See all Orders from today for further details    Objective:   Vitals:   12/13/19 0900 12/13/19 0930 12/13/19 0951 12/13/19 1018  BP: 109/64 (!) 106/59  127/78  Pulse: 92 93  87  Resp: 15 14  15   Temp:   98.9 F (37.2 C) 97.7 F (36.5 C)  TempSrc:   Axillary Oral  SpO2: 95% 95%  95%  Weight:      Height:        Wt Readings from Last 3 Encounters:  12/12/19 54.4 kg  08/26/19 51.7 kg  04/20/19 54.4 kg    No intake or output data in the 24 hours ending 12/13/19 1528   Physical Exam  Awake extremely confused, mild generalized tremors, no focal deficits, slightly hyperreflexic, [place].AT,  Supple Neck,No JVD, No cervical  lymphadenopathy appriciated.  Symmetrical Chest wall movement, Good air movement bilaterally, CTAB RRR,No Gallops,Rubs or new Murmurs, No Parasternal Heave +ve B.Sounds, Abd Soft, No tenderness, No organomegaly appriciated, No rebound - guarding or rigidity. No Cyanosis, Clubbing or edema, No new Rash or bruise       Data Review:    CBC Recent Labs  Lab 12/12/19 2250  WBC 5.1  HGB 12.1  HCT 34.0*  PLT 211  MCV 91.4  MCH 32.5  MCHC 35.6  RDW 12.7  LYMPHSABS 1.8  MONOABS 0.6  EOSABS 0.3  BASOSABS 0.0    Chemistries  Recent Labs  Lab 12/12/19 2250  NA 137  K 3.7  CL 101  CO2 24  GLUCOSE 100*  BUN 22  CREATININE 1.10*  CALCIUM 9.0  AST 30  ALT 18  ALKPHOS 131*  BILITOT 0.9   ------------------------------------------------------------------------------------------------------------------ No results for input(s): CHOL, HDL, LDLCALC, TRIG, CHOLHDL, LDLDIRECT in the last 72 hours.  No results found for: HGBA1C ------------------------------------------------------------------------------------------------------------------ No results for input(s): TSH, T4TOTAL, T3FREE, THYROIDAB in the last 72 hours.  Invalid input(s): FREET3  Cardiac Enzymes No results for input(s): CKMB, TROPONINI, MYOGLOBIN in the last 168 hours.  Invalid input(s): CK ------------------------------------------------------------------------------------------------------------------ No results found for: BNP  Micro Results Recent Results (from the past 240 hour(s))  SARS CORONAVIRUS 2 (TAT 6-24 HRS) Nasopharyngeal Nasopharyngeal Swab     Status: Abnormal   Collection Time: 12/13/19  2:32 AM   Specimen: Nasopharyngeal Swab  Result Value Ref Range Status  SARS Coronavirus 2 POSITIVE (A) NEGATIVE Final    Comment: RESULT CALLED TO, READ BACK BY AND VERIFIED WITH: J. BLUE, [facility] ED CHG RN AT [phone] ON 12/13/19 BY C. JESSUP, MT. (NOTE) SARS-CoV-2 target nucleic acids are DETECTED. The  SARS-CoV-2 RNA is generally detectable in upper and lower respiratory specimens during the acute phase of infection. Positive results are indicative of the presence of SARS-CoV-2 RNA. Clinical correlation with patient history and other diagnostic information is  necessary to determine patient infection status. Positive results do not rule out bacterial infection or co-infection with other viruses.  The expected result is Negative. Fact Sheet for Patients: SugarRoll.be Fact Sheet for Healthcare Providers: https://www.woods-mathews.com/ This test is not yet approved or cleared by the [place] FDA and  has been authorized for detection and/or diagnosis of SARS-CoV-2 by FDA under an Emergency Use Authorization (EUA). This EUA will remain  in effect (meaning this te st can be used) for the duration of the COVID-19 declaration under Section 564(b)(1) of the Act, 21 U.S.C. section 360bbb-3(b)(1), unless the authorization is terminated or revoked sooner. Performed at [facility], [place] [address] [place], [place] [address]     Radiology Reports EEG  Result Date: 12/13/2019 Lora Havens, MD     12/13/2019  2:50 PM Patient Name: SUANY RETTER MRN: [id number] Epilepsy Attending: Lora Havens Referring Physician/Provider: Dr. Kerney Elbe Date: 12/13/2019 Duration: 26.49 minutes Patient history: 79 year old female presented with 2 weeks of worsening altered mental status.  EEG evaluate for seizures. Level of alertness: Awake AEDs during EEG study: Lorazepam Technical aspects: This EEG study was done with scalp electrodes positioned according to the 10-20 International system of electrode placement. Electrical activity was acquired at a sampling rate of 500Hz  and reviewed with a high frequency filter of 70Hz  and a low frequency filter of 1Hz . EEG data were recorded continuously and digitally stored. Description: The posterior dominant rhythm  consists of 6-7 Hz activity of moderate voltage (25-35 uV) seen predominantly in posterior head regions, symmetric and reactive to eye opening and eye closing.  EEG also showed continuous generalized 3 to 6 Hz theta-delta slowing.   Hyperventilation and photic stimulation were not performed. Abnormality -Background slow -Continued slow, generalized IMPRESSION: This study is suggestive of mild to moderate diffuse encephalopathy, nonspecific etiology. No seizures or epileptiform discharges were seen throughout the recording. Lora Havens   CT Head Wo Contrast  Result Date: 12/12/2019 CLINICAL DATA:  Altered mental status.  Decreased appetite. EXAM: CT HEAD WITHOUT CONTRAST TECHNIQUE: Contiguous axial images were obtained from the base of the skull through the vertex without intravenous contrast. COMPARISON:  12/03/2019 FINDINGS: Brain: No apparent change. Chronic small-vessel ischemic changes of the cerebral hemispheric white matter. No sign of acute infarction, mass lesion, hemorrhage, hydrocephalus or extra-axial collection. Vascular: There is atherosclerotic calcification of the major vessels at the base of the brain. Skull: Negative Sinuses/Orbits: Clear/normal Other: None IMPRESSION: No acute finding. Chronic small-vessel ischemic changes of the white matter, similar to the previous examination Electronically Signed   By: Nelson Chimes M.D.   On: 12/12/2019 23:10   DG CHEST PORT 1 VIEW  Result Date: 12/13/2019 CLINICAL DATA:  Altered level of consciousness EXAM: PORTABLE CHEST 1 VIEW COMPARISON:  11/01/2010 FINDINGS: Single frontal view of the chest demonstrates an unremarkable cardiac silhouette. No airspace disease, effusion, or pneumothorax. Minimal scarring at the lung bases. No acute bony abnormalities. IMPRESSION: 1. No acute intrathoracic process. Electronically Signed   By:  Randa Ngo M.D.   On: 12/13/2019 02:22

## 2019-12-13 NOTE — Progress Notes (Signed)
Jill Larson [id number] Admission Data: 12/13/2019 11:12 AM Attending Provider: Thurnell Lose, MD  [id number], Jill Melena, NP Consults/ Treatment Team:   Jill Larson is a 79 y.o. female patient admitted from ED awake, alert  & orientated  X 3,  Full Code, VSS - Blood pressure 127/78, pulse 87, temperature 97.7 F (36.5 C), temperature source Oral, resp. rate 15, height 5\' 4"  (1.626 m), weight 54.4 kg, SpO2 95 %., O2   no c/o shortness of breath, no c/o chest pain, no distress noted. Tele # [id number] placed and pt is currently running:normal sinus rhythm.   IV site WDL:  forearm right, condition patent and no redness with a transparent dsg that's clean dry and intact.  Allergies:  No Known Allergies   Past Medical History:  Diagnosis Date  . Anxiety   . Benign fundic gland polyps of stomach 2001  . DDD (degenerative disc disease)   . Depression   . Depression   . Fibromyalgia   . Hyperlipidemia   . Hypertension   . Mitral valve prolapse   . Osteoporosis   . Scoliosis      Pt orientation to unit, room and routine. Information packet given to patient/family and safety video watched.  Admission INP armband ID verified with patient/family, and in place. SR up x 2, fall risk assessment complete with Patient and family verbalizing understanding of risks associated with falls. Pt verbalizes an understanding of how to use the call bell and to call for help before getting out of bed.No evidence of skin breakdown.  Will cont to monitor and assist as needed.  [place], RN 12/13/2019 11:12 AM

## 2019-12-13 NOTE — Procedures (Signed)
Patient Name: Jill Larson  MRN: [id number]  Epilepsy Attending: Lora Havens  Referring Physician/Provider: Dr. Kerney Elbe Date: 12/13/2019 Duration: 26.49 minutes  Patient history: 80 year old female presented with 2 weeks of worsening altered mental status.  EEG evaluate for seizures.  Level of alertness: Awake  AEDs during EEG study: Lorazepam  Technical aspects: This EEG study was done with scalp electrodes positioned according to the 10-20 International system of electrode placement. Electrical activity was acquired at a sampling rate of 500Hz  and reviewed with a high frequency filter of 70Hz  and a low frequency filter of 1Hz . EEG data were recorded continuously and digitally stored.   Description: The posterior dominant rhythm consists of 6-7 Hz activity of moderate voltage (25-35 uV) seen predominantly in posterior head regions, symmetric and reactive to eye opening and eye closing.  EEG also showed continuous generalized 3 to 6 Hz theta-delta slowing.   Hyperventilation and photic stimulation were not performed.  Abnormality -Background slow -Continued slow, generalized  IMPRESSION: This study is suggestive of mild to moderate diffuse encephalopathy, nonspecific etiology. No seizures or epileptiform discharges were seen throughout the recording.  Jill Larson

## 2019-12-14 ENCOUNTER — Inpatient Hospital Stay (HOSPITAL_COMMUNITY): Payer: Medicare Other

## 2019-12-14 LAB — COMPREHENSIVE METABOLIC PANEL
ALT: 19 U/L (ref 0–44)
AST: 31 U/L (ref 15–41)
Albumin: 3.2 g/dL — ABNORMAL LOW (ref 3.5–5.0)
Alkaline Phosphatase: 111 U/L (ref 38–126)
Anion gap: 11 (ref 5–15)
BUN: 10 mg/dL (ref 8–23)
CO2: 22 mmol/L (ref 22–32)
Calcium: 8.2 mg/dL — ABNORMAL LOW (ref 8.9–10.3)
Chloride: 103 mmol/L (ref 98–111)
Creatinine, Ser: 0.97 mg/dL (ref 0.44–1.00)
GFR calc Af Amer: >60 mL/min (ref >60)
GFR calc non Af Amer: 56 mL/min — ABNORMAL LOW (ref >60)
Glucose, Bld: 109 mg/dL — ABNORMAL HIGH (ref 70–99)
Potassium: 3.7 mmol/L (ref 3.5–5.1)
Sodium: 136 mmol/L (ref 135–145)
Total Bilirubin: 0.8 mg/dL (ref 0.3–1.2)
Total Protein: 6.2 g/dL — ABNORMAL LOW (ref 6.5–8.1)

## 2019-12-14 LAB — CBC WITH DIFFERENTIAL/PLATELET
Abs Immature Granulocytes: 0.02 10*3/uL (ref 0.00–0.07)
Basophils Absolute: 0 10*3/uL (ref 0.0–0.1)
Basophils Relative: 0 %
Eosinophils Absolute: 0 10*3/uL (ref 0.0–0.5)
Eosinophils Relative: 0 %
HCT: 36.4 % (ref 36.0–46.0)
Hemoglobin: 12.4 g/dL (ref 12.0–15.0)
Immature Granulocytes: 0 %
Lymphocytes Relative: 14 %
Lymphs Abs: 1.2 10*3/uL (ref 0.7–4.0)
MCH: 30.7 pg (ref 26.0–34.0)
MCHC: 34.1 g/dL (ref 30.0–36.0)
MCV: 90.1 fL (ref 80.0–100.0)
Monocytes Absolute: 0.9 10*3/uL (ref 0.1–1.0)
Monocytes Relative: 10 %
Neutro Abs: 6.6 10*3/uL (ref 1.7–7.7)
Neutrophils Relative %: 76 %
Platelets: 202 10*3/uL (ref 150–400)
RBC: 4.04 MIL/uL (ref 3.87–5.11)
RDW: 12.7 % (ref 11.5–15.5)
WBC: 8.6 10*3/uL (ref 4.0–10.5)
nRBC: 0 % (ref 0.0–0.2)

## 2019-12-14 LAB — D-DIMER, QUANTITATIVE: D-Dimer, Quant: 3.27 ug/mL-FEU — ABNORMAL HIGH (ref 0.00–0.50)

## 2019-12-14 LAB — PROCALCITONIN: Procalcitonin: <0.1 ng/mL

## 2019-12-14 LAB — MAGNESIUM: Magnesium: 1.8 mg/dL (ref 1.7–2.4)

## 2019-12-14 LAB — C-REACTIVE PROTEIN: CRP: 7.2 mg/dL — ABNORMAL HIGH (ref <1.0)

## 2019-12-14 LAB — BRAIN NATRIURETIC PEPTIDE: B Natriuretic Peptide: 22.3 pg/mL (ref 0.0–100.0)

## 2019-12-14 MED ORDER — ENOXAPARIN SODIUM 40 MG/0.4ML ~~LOC~~ SOLN
40.0000 mg | Freq: Two times a day (BID) | SUBCUTANEOUS | Status: DC
  Administered 2019-12-14 – 2019-12-17 (×7): 40 mg via SUBCUTANEOUS
  Filled 2019-12-14 (×7): qty 0.4

## 2019-12-14 MED ORDER — LORAZEPAM 2 MG/ML IJ SOLN: 0.5000 mg | Freq: Three times a day (TID) | INTRAMUSCULAR | Status: DC

## 2019-12-14 MED ORDER — SODIUM CHLORIDE 0.9 % IV SOLN
1.0000 g | INTRAVENOUS | Status: AC
  Administered 2019-12-14 – 2019-12-16 (×3): 1 g via INTRAVENOUS
  Filled 2019-12-14 (×3): qty 10

## 2019-12-14 MED ORDER — LORAZEPAM 2 MG/ML IJ SOLN
0.5000 mg | Freq: Four times a day (QID) | INTRAMUSCULAR | Status: DC | PRN
  Administered 2019-12-15 – 2019-12-16 (×2): 0.5 mg via INTRAVENOUS
  Filled 2019-12-14 (×2): qty 1

## 2019-12-14 MED ORDER — LACTATED RINGERS IV SOLN: INTRAVENOUS | Status: AC

## 2019-12-14 NOTE — Progress Notes (Signed)
Patients daughter [name] was updated of the plan of care and her mother conditions.

## 2019-12-14 NOTE — Consult Note (Signed)
  Attempted to complete telemedicine psychiatry consult with help from RN.  Patient unable to be awakened.  RN made several attempts to awaken patient.  Patient unable to participate in assessment at this time.  Dr. Candiss Norse made aware.  Will attempt again tomorrow.

## 2019-12-14 NOTE — Progress Notes (Signed)
Spoke to pt's daughter Santiago Glad at [phone].  Santiago Glad states her mom was oriented until she started taking anti-depressant medications and progressively got confused.  Santiago Glad also said pt was complaining of severe low abd pain for several days.  Pt now having brown watery stools.  Will continue to monitor labs and signs and symptoms.

## 2019-12-14 NOTE — Progress Notes (Signed)
PROGRESS NOTE                                                                                                                                                                                                             Patient Demographics:    Jill Larson, is a 79 y.o. female, DOB - [birth year], [id number]  Outpatient Primary MD for the patient is Imagene Riches, NP    LOS - 1  Admit date - 12/12/2019    Chief Complaint  Patient presents with  . Altered Mental Status       Brief Narrative 79 year old frail elderly Caucasian female with history of depression and hypertension was brought in by her daughter for evaluation of progressive mental decline with excessive fatigue, sleepiness and mild confusion.  In the ER she was found to have mild tremors, confusion, CT head was nonacute, she was seen by neurology and hospitalist team was requested to admit for possible serotonin syndrome.   Subjective:   Patient appears more sleepy today under the effect of benzodiazepine scheduled, in no distress, moves all 4 extremities to painful stimuli, answers basic questions.  Denies any headache.  No shortness of breath.   Assessment  & Plan :     1. Acute Covid 19 Viral infection - no evidence of pulmonary involvement, will monitor clinically along with inflammatory markers.  Will hold remdesivir and steroids for now.  Encouraged the patient to sit up in chair in the daytime use I-S and flutter valve for pulmonary toiletry and then prone in bed when at night.     SpO2: 99 %  Recent Labs  Lab 12/13/19 0232 12/13/19 1553 12/14/19 0615  CRP  --   --  7.2*  DDIMER  --   --  3.27*  BNP  --   --  22.3  PROCALCITON  --  <0.10 <0.10  SARSCOV2NAA POSITIVE*  --   --       2.  Metabolic encephalopathy most likely serotonin syndrome.  Hold offending medications, IV fluids, she was placed on very high doses of benzodiazepines which  I have cut back to only low-dose as needed for severe agitation, no focal deficits, head CT and EEG nonacute.  Psych and neuro consulted.  Will continue to monitor.  3.  Hypertension.  Stable as needed IV hydralazine will be  ordered.  4.  Acute urinary retention.  Could be due to serotonin syndrome as wel l-  Foley Flomax and monitor.    Condition - Extremely Guarded  Family Communication  : Called daughter on her listed phone number on 12/13/2019 at 3:33 PM.  Updated daughter on 12/14/2019  Code Status : Full  Diet :   Diet Order            DIET SOFT Room service appropriate? Yes; Fluid consistency: Thin  Diet effective now               Disposition Plan  : Stay in the hospital, getting treatment for metabolic encephalopathy, serotonin syndrome  Consults  : Neurology, psychiatry  Procedures  :    CT head.  Nonacute  EEG.  Nonspecific changes no seizure focus.  RI brain.  Pending  PUD Prophylaxis : None  DVT Prophylaxis  :  Lovenox   Lab Results  Component Value Date   PLT 202 12/14/2019    Inpatient Medications  Scheduled Meds: . Chlorhexidine Gluconate Cloth  6 each Topical Daily  . enoxaparin (LOVENOX) injection  40 mg Subcutaneous Q12H  . tamsulosin  0.4 mg Oral Daily   Continuous Infusions: . cefTRIAXone (ROCEPHIN)  IV    . lactated ringers 50 mL/hr at 12/14/19 0822   PRN Meds:.acetaminophen **OR** acetaminophen, albuterol, LORazepam, ondansetron **OR** ondansetron (ZOFRAN) IV  Antibiotics  :    Anti-infectives (From admission, onward)   Start     Dose/Rate Route Frequency Ordered Stop   12/14/19 1130  cefTRIAXone (ROCEPHIN) 1 g in sodium chloride 0.9 % 100 mL IVPB     1 g 200 mL/hr over 30 Minutes Intravenous Every 24 hours 12/14/19 1119 12/17/19 1129       Time Spent in minutes  30   Lala Lund M.D on 12/14/2019 at 11:24 AM  To page go to [email or web address] - password [facility]  [facility] -  Office  [phone]     See all Orders  from today for further details    Objective:   Vitals:   12/13/19 2000 12/14/19 0259 12/14/19 0400 12/14/19 0900  BP:      Pulse:      Resp:    15  Temp: 97.8 F (36.6 C) (!) 102.3 F (39.1 C) 98.9 F (37.2 C)   TempSrc: Axillary Axillary Axillary   SpO2:      Weight:      Height:        Wt Readings from Last 3 Encounters:  12/12/19 54.4 kg  08/26/19 51.7 kg  04/20/19 54.4 kg     Intake/Output Summary (Last 24 hours) at 12/14/2019 1124 Last data filed at 12/14/2019 [id number] Gross per 24 hour  Intake 677.96 ml  Output 1425 ml  Net -747.04 ml     Physical Exam  Patient slightly somnolent, appears to be in no distress, [place].AT,PERRAL Supple Neck,No JVD, No cervical lymphadenopathy appriciated.  Symmetrical Chest wall movement, Good air movement bilaterally, CTAB RRR,No Gallops, Rubs or new Murmurs, No Parasternal Heave +ve B.Sounds, Abd Soft, No tenderness, No organomegaly appriciated, No rebound - guarding or rigidity. No Cyanosis, Clubbing or edema, No new Rash or bruise    Data Review:    CBC Recent Labs  Lab 12/12/19 2250 12/14/19 0615  WBC 5.1 8.6  HGB 12.1 12.4  HCT 34.0* 36.4  PLT 211 202  MCV 91.4 90.1  MCH 32.5 30.7  MCHC 35.6 34.1  RDW 12.7 12.7  LYMPHSABS 1.8  1.2  MONOABS 0.6 0.9  EOSABS 0.3 0.0  BASOSABS 0.0 0.0    Chemistries  Recent Labs  Lab 12/12/19 2250 12/14/19 0615  NA 137 136  K 3.7 3.7  CL 101 103  CO2 24 22  GLUCOSE 100* 109*  BUN 22 10  CREATININE 1.10* 0.97  CALCIUM 9.0 8.2*  MG  --  1.8  AST 30 31  ALT 18 19  ALKPHOS 131* 111  BILITOT 0.9 0.8   ------------------------------------------------------------------------------------------------------------------ No results for input(s): CHOL, HDL, LDLCALC, TRIG, CHOLHDL, LDLDIRECT in the last 72 hours.  No results found for: HGBA1C ------------------------------------------------------------------------------------------------------------------ No results for  input(s): TSH, T4TOTAL, T3FREE, THYROIDAB in the last 72 hours.  Invalid input(s): FREET3  Cardiac Enzymes No results for input(s): CKMB, TROPONINI, MYOGLOBIN in the last 168 hours.  Invalid input(s): CK ------------------------------------------------------------------------------------------------------------------    Component Value Date/Time   BNP 22.3 12/14/2019 0615    Micro Results Recent Results (from the past 240 hour(s))  SARS CORONAVIRUS 2 (TAT 6-24 HRS) Nasopharyngeal Nasopharyngeal Swab     Status: Abnormal   Collection Time: 12/13/19  2:32 AM   Specimen: Nasopharyngeal Swab  Result Value Ref Range Status   SARS Coronavirus 2 POSITIVE (A) NEGATIVE Final    Comment: RESULT CALLED TO, READ BACK BY AND VERIFIED WITH: J. BLUE, [facility] ED CHG RN AT [phone] ON 12/13/19 BY C. JESSUP, MT. (NOTE) SARS-CoV-2 target nucleic acids are DETECTED. The SARS-CoV-2 RNA is generally detectable in upper and lower respiratory specimens during the acute phase of infection. Positive results are indicative of the presence of SARS-CoV-2 RNA. Clinical correlation with patient history and other diagnostic information is  necessary to determine patient infection status. Positive results do not rule out bacterial infection or co-infection with other viruses.  The expected result is Negative. Fact Sheet for Patients: SugarRoll.be Fact Sheet for Healthcare Providers: https://www.woods-mathews.com/ This test is not yet approved or cleared by the [place] FDA and  has been authorized for detection and/or diagnosis of SARS-CoV-2 by FDA under an Emergency Use Authorization (EUA). This EUA will remain  in effect (meaning this te st can be used) for the duration of the COVID-19 declaration under Section 564(b)(1) of the Act, 21 U.S.C. section 360bbb-3(b)(1), unless the authorization is terminated or revoked sooner. Performed at [facility], [place]  [address] [place], [place] [address]     Radiology Reports EEG  Result Date: 12/13/2019 Lora Havens, MD     12/13/2019  2:50 PM Patient Name: ROCHANDA DIESING MRN: [id number] Epilepsy Attending: Lora Havens Referring Physician/Provider: Dr. Kerney Elbe Date: 12/13/2019 Duration: 26.49 minutes Patient history: 79 year old female presented with 2 weeks of worsening altered mental status.  EEG evaluate for seizures. Level of alertness: Awake AEDs during EEG study: Lorazepam Technical aspects: This EEG study was done with scalp electrodes positioned according to the 10-20 International system of electrode placement. Electrical activity was acquired at a sampling rate of 500Hz  and reviewed with a high frequency filter of 70Hz  and a low frequency filter of 1Hz . EEG data were recorded continuously and digitally stored. Description: The posterior dominant rhythm consists of 6-7 Hz activity of moderate voltage (25-35 uV) seen predominantly in posterior head regions, symmetric and reactive to eye opening and eye closing.  EEG also showed continuous generalized 3 to 6 Hz theta-delta slowing.   Hyperventilation and photic stimulation were not performed. Abnormality -Background slow -Continued slow, generalized IMPRESSION: This study is suggestive of mild to moderate diffuse encephalopathy, nonspecific  etiology. No seizures or epileptiform discharges were seen throughout the recording. Lora Havens   CT Head Wo Contrast  Result Date: 12/12/2019 CLINICAL DATA:  Altered mental status.  Decreased appetite. EXAM: CT HEAD WITHOUT CONTRAST TECHNIQUE: Contiguous axial images were obtained from the base of the skull through the vertex without intravenous contrast. COMPARISON:  12/03/2019 FINDINGS: Brain: No apparent change. Chronic small-vessel ischemic changes of the cerebral hemispheric white matter. No sign of acute infarction, mass lesion, hemorrhage, hydrocephalus or extra-axial collection. Vascular: There is  atherosclerotic calcification of the major vessels at the base of the brain. Skull: Negative Sinuses/Orbits: Clear/normal Other: None IMPRESSION: No acute finding. Chronic small-vessel ischemic changes of the white matter, similar to the previous examination Electronically Signed   By: Nelson Chimes M.D.   On: 12/12/2019 23:10   DG CHEST PORT 1 VIEW  Result Date: 12/13/2019 CLINICAL DATA:  Altered level of consciousness EXAM: PORTABLE CHEST 1 VIEW COMPARISON:  11/01/2010 FINDINGS: Single frontal view of the chest demonstrates an unremarkable cardiac silhouette. No airspace disease, effusion, or pneumothorax. Minimal scarring at the lung bases. No acute bony abnormalities. IMPRESSION: 1. No acute intrathoracic process. Electronically Signed   By: Randa Ngo M.D.   On: 12/13/2019 02:22   DG Abd Portable 1V  Result Date: 12/14/2019 CLINICAL DATA:  Diarrhea.  COVID positive EXAM: PORTABLE ABDOMEN - 1 VIEW COMPARISON:  None. FINDINGS: Normal bowel gas pattern. Moderate stool volume without rectal over distension. No concerning mass effect or calcification. Extensive lumbar spine fusion extending to the pelvis with apparent pelvic screw loosening on the left. IMPRESSION: Normal bowel gas pattern. Electronically Signed   By: Monte Fantasia M.D.   On: 12/14/2019 09:01

## 2019-12-14 NOTE — Evaluation (Signed)
Occupational Therapy Evaluation Patient Details Name: Jill Larson MRN: [id number] DOB: 1941/02/23 Today's Date: 12/14/2019    History of Present Illness 79 year old female admitted 12/12/19 presenting with progressive decline in the last weeks with excessive fatigue, sleepiness and mild confusion per daughter's report. In ER, patient noted with mild tremors. CT head nonacute. Patient with altered mental status. She recently started medication for depression. 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 EEG negative for seizures. MRI brain ordered. 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 Patient also with diarrhea and +COVID. PMH: depression, HTN   Clinical Impression   Pt seen today for eval (as cotx with PT).  At this time, pt demonstrating severely impaired cognition and decreased level of arousal thought to be due to possible serotonin syndrome. Dtr reports that pt was independent however began to decline cognitively shortly after starting medications for depression.  At this time pt would require total assist for basic self care and requires mod a x2 for basic transfers, standing balance. Feel pt's functional and mobility may significantly improve as cognition improves. Would however at this time recommend SNF rehab unless cognition significantly improves in the near future.     Follow Up Recommendations  SNF(unless cognition significantly improves)    Equipment Recommendations  Other (comment)(TBD)    Recommendations for Other Services       Precautions / Restrictions Precautions Precautions: Fall Precaution Comments: bilat mitts; pt with very frequent loose stools Restrictions Weight Bearing Restrictions: No      Mobility Bed Mobility Overal bed mobility: Needs Assistance Bed Mobility: Supine to Sit;Sit to Supine;Rolling Rolling: Max assist   Supine to sit: Mod assist;+2 for physical assistance Sit to supine: Mod assist;Max assist;+2 for  physical assistance      Transfers Overall transfer level: Needs assistance Equipment used: 2 person hand held assist Transfers: Sit to/from Stand(from EOB) Sit to Stand: Mod assist;+2 physical assistance         General transfer comment: sit<>stand from EOB, initially posterior lean, improved with continued standing    Balance Overall balance assessment: Needs assistance Sitting-balance support: Feet unsupported Sitting balance-Leahy Scale: Poor(progressing from poor to fair during session) Sitting balance - Comments: progressed to close supervision sitting EOB   Standing balance support: Bilateral upper extremity supported Standing balance-Leahy Scale: Poor Standing balance comment: modA x 2 progressing to minA x 2 static standing at EOB                           ADL either performed or assessed with clinical judgement   ADL Overall ADL's : Needs assistance/impaired Eating/Feeding: Total assistance   Grooming: Total assistance   Upper Body Bathing: Total assistance   Lower Body Bathing: Total assistance   Upper Body Dressing : Total assistance   Lower Body Dressing: Total assistance                 General ADL Comments: pt with severely impaired cognition at this time and therefore requires total assist - anticipate that level of assistance will decrease as pt improves cognitively     Vision   Vision Assessment?: Vision impaired- to be further tested in functional context Additional Comments: unable to assess due to cognition at this time     Perception     Praxis      Pertinent Vitals/Pain Pain Assessment: Faces Faces Pain Scale: Hurts little more Pain Location: R arm with rolling to left, bottom with sitting EOB  Pain Descriptors / Indicators: Grimacing;Moaning Pain Intervention(s): Limited activity within patient's tolerance;Monitored during session     Hand Dominance     Extremity/Trunk Assessment Upper Extremity Assessment Upper  Extremity Assessment: Difficult to assess due to impaired cognition(pt did move BUE spontaneously - will continue to assess via functional activity)   Lower Extremity Assessment Lower Extremity Assessment: Defer to PT evaluation       Communication Communication Communication: Expressive difficulties;Receptive difficulties   Cognition Arousal/Alertness: Lethargic Behavior During Therapy: (eyes closed during session, opened briefly with mobility) Overall Cognitive Status: Impaired/Different from baseline Area of Impairment: Orientation;Following commands;Safety/judgement;Awareness;Attention                               General Comments: Patient with vocalization to pain only during session.   General Comments       Exercises     Shoulder Instructions      Home Living Family/patient expects to be discharged to:: Private residence Living Arrangements: Children Available Help at Discharge: Family Type of Home: House Home Access: Stairs to enter Technical brewer of Steps: 4 Entrance Stairs-Rails: Right Home Layout: One level     Bathroom Shower/Tub: Occupational psychologist: Handicapped height     Home Equipment: Environmental consultant - 2 wheels;Bedside commode;Shower seat   Additional Comments: Called daughter via phone during session. Daughter reports they live with patient. There is usually someone home with patient. Recently started assisting with bathing and dressing but prior to a few weeks ago, patient was independent.      Prior Functioning/Environment Level of Independence: Independent        Comments: Independent PTA no AD but has a RW at home. She also has a shower chair and BSC (that she used PTA?). The past few weeks, patient's dtr was assisting patient with bathing and dressing due to her decline.        OT Problem List: Decreased strength;Decreased activity tolerance;Impaired balance (sitting and/or standing);Decreased safety  awareness;Decreased cognition;Decreased knowledge of use of DME or AE      OT Treatment/Interventions: Self-care/ADL training;Therapeutic exercise;Patient/family education;Therapeutic activities;Balance training    OT Goals(Current goals can be found in the care plan section) Acute Rehab OT Goals Patient Stated Goal: pt unable OT Goal Formulation: Patient unable to participate in goal setting Time For Goal Achievement: 12/27/19 Potential to Achieve Goals: Fair  OT Frequency: Min 2X/week   Barriers to D/C:    pt has family that can assist however burden of care at this time would be signficant       Co-evaluation PT/OT/SLP Co-Evaluation/Treatment: Yes Reason for Co-Treatment: Complexity of the patient's impairments (multi-system involvement);For patient/therapist safety          AM-PAC OT "6 Clicks" Daily Activity     Outcome Measure Help from another person eating meals?: Total Help from another person taking care of personal grooming?: Total Help from another person toileting, which includes using toliet, bedpan, or urinal?: Total Help from another person bathing (including washing, rinsing, drying)?: Total Help from another person to put on and taking off regular upper body clothing?: Total Help from another person to put on and taking off regular lower body clothing?: Total 6 Click Score: 6   End of Session    Activity Tolerance: Patient limited by lethargy;Other (comment)(limited by level of arousal and severely impaired cognition at this time) Patient left: in bed;with call bell/phone within reach;with bed alarm set  OT Visit Diagnosis:  Unsteadiness on feet (R26.81);Muscle weakness (generalized) (M62.81);Other symptoms and signs involving cognitive function                Time: [id number] OT Time Calculation (min): 32 min Charges:  OT General Charges $OT Visit: 1 Visit OT Treatments $Self Care/Home Management : 8-22 mins   Quay Burow,  OTR/L 12/14/2019, 3:02 PM

## 2019-12-14 NOTE — Progress Notes (Addendum)
Patient was a sleep and unable to participate with the NP, Otila Kluver from  Psychiatry for evaluation. will try again tomorrow.

## 2019-12-14 NOTE — Evaluation (Addendum)
Physical Therapy Evaluation Patient Details Name: Jill Larson MRN: [id number] DOB: [birth year] Today's Date: 12/14/2019   History of Present Illness  79 year old female admitted 12/12/19 presenting with progressive decline in the last weeks with excessive fatigue, sleepiness and mild confusion per daughter's report. In ER, patient noted with mild tremors. CT head nonacute. Patient with altered mental status. She recently started medication for depression. 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 EEG negative for seizures. MRI brain ordered. 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 Patient also with diarrhea and +COVID. PMH: depression, HTN    Clinical Impression  Patient presents with impaired cognition and alertness. Improved alertness with mobility. Patient tremulous intermittently with mobility especially after standing (due to meds?). Patient unsteady in standing, improved with continued standing and patient adjusting footing but she is still far below her PLOF as stated by her daughter. Patient was ambulatory without an assistive device a few weeks ago. HR up to 124 bpm with standing, oxygen stable on room air. Unable to safely transfer patient to chair this session. Patient assisted back to bed with bed alarm on, mitts on.  Patient would benefit from continued skilled PT services and pending mobility progression and cognition while in the hospital, at this time would benefit from short term rehabilitation at [facility].    Follow Up Recommendations SNF;Supervision/Assistance - 24 hour;Supervision for mobility/OOB    Equipment Recommendations  (TBD closer to discharge)       Precautions / Restrictions Precautions Precautions: Fall(cognition) Precaution Comments: bilat mitts((on at start, during and end of session)) Restrictions Weight Bearing Restrictions: No      Mobility  Bed Mobility Overal bed mobility: Needs Assistance Bed Mobility: Supine to Sit;Sit  to Supine;Rolling Rolling: Max assist   Supine to sit: Mod assist;+2 for physical assistance Sit to supine: Mod assist;Max assist;+2 for physical assistance      Transfers Overall transfer level: Needs assistance Equipment used: 2 person hand held assist Transfers: Sit to/from Stand((from EOB)) Sit to Stand: Mod assist;+2 physical assistance         General transfer comment: sit<>stand from EOB, initially posterior lean, improved with continued standing  Ambulation/Gait   General Gait Details: unable due to assess due to impaired balance, patient's fear, and patient's cognition     Balance Overall balance assessment: Needs assistance Sitting-balance support: Feet unsupported Sitting balance-Leahy Scale: Poor(poor progressing to fair) Sitting balance - Comments: progressed to close supervision sitting EOB   Standing balance support: (bilat HHA) Standing balance-Leahy Scale: Poor Standing balance comment: modA x 2 progressing to minA x 2 static standing at EOB       Pertinent Vitals/Pain Faces Pain Scale: Hurts little more Pain Location: R arm with rolling to left, bottom with sitting EOB    Home Living Family/patient expects to be discharged to:: Private residence Living Arrangements: Children(dtr Santiago Glad), SIL (grandson is sometimes there)) Available Help at Discharge: Family Type of Home: House Home Access: Stairs to enter Entrance Stairs-Rails: Right(bilat rails can't reach both at same time) Entrance Stairs-Number of Steps: 4 Home Layout: One level Home Equipment: Environmental consultant - 2 wheels;Bedside commode;Shower seat Additional Comments: Called daughter via phone during session. Daughter reports they live with patient. There is usually someone home with patient. Recently started assisting with bathing and dressing but prior to a few weeks ago, patient was independent.    Prior Function Level of Independence: Independent         Comments: Independent PTA no AD but  has a RW at  home. She also has a shower chair and BSC (that she used PTA?). The past few weeks, patient's dtr was assisting patient with bathing and dressing due to her decline.        Extremity/Trunk Assessment    Decreased trunk rotation/disassociation with functional mobility Decreased tolerance for PROM BLEs          Communication   Communication: Expressive difficulties;Receptive difficulties((withdraws and grimaces when in pain))  Cognition Arousal/Alertness: Lethargic Behavior During Therapy: (eyes closed during session, opened briefly during mobility) Overall Cognitive Status: Impaired/Different from baseline Area of Impairment: Orientation;Following commands;Safety/judgement;Awareness     General Comments: Patient with vocalization to pain only during session.             Assessment/Plan    PT Assessment Patient needs continued PT services  PT Problem List Decreased strength;Decreased activity tolerance;Decreased mobility;Decreased cognition;Decreased safety awareness;Decreased balance       PT Treatment Interventions Gait training;Functional mobility training;Therapeutic activities;Therapeutic exercise;Balance training;Cognitive remediation;Patient/family education    PT Goals (Current goals can be found in the Care Plan section)  Acute Rehab PT Goals PT Goal Formulation: Patient unable to participate in goal setting Time For Goal Achievement: 12/27/19 Potential to Achieve Goals: Fair(pending improvement in alertness and cognition)    Frequency Min 3X/week        Co-evaluation PT/OT/SLP Co-Evaluation/Treatment: Yes        AM-PAC PT "6 Clicks" Mobility  Outcome Measure Help needed turning from your back to your side while in a flat bed without using bedrails?: A Lot Help needed moving from lying on your back to sitting on the side of a flat bed without using bedrails?: A Lot Help needed moving to and from a bed to a chair (including a wheelchair)?:  Total Help needed standing up from a chair using your arms (e.g., wheelchair or bedside chair)?: A Lot Help needed to walk in hospital room?: Total Help needed climbing 3-5 steps with a railing? : Total 6 Click Score: 9    End of Session   Activity Tolerance: Patient limited by lethargy;Patient limited by fatigue Patient left: in bed;with bed alarm set(mitts on, rails up) Nurse Communication: (spoke to nurse prior to PT evaluation) PT Visit Diagnosis: Unsteadiness on feet (R26.81);Other abnormalities of gait and mobility (R26.89);Muscle weakness (generalized) (M62.81)    Time: 911-944 33 minutes     Charges:   1 Visit Mod Evaluation            Birdie Hopes, DPT, PT Acute Rehab [phone]    Birdie Hopes 12/14/2019, 2:22 PM

## 2019-12-14 NOTE — Progress Notes (Signed)
Pt having continuous watery brown stools, notified MD and requested flexi seal, inserted flexi seal, inflated, and pt pushed it out along with large semi hard stool, digital dig palpation of semi hard stool, did not re insert flexi seal.

## 2019-12-15 ENCOUNTER — Inpatient Hospital Stay (HOSPITAL_COMMUNITY): Payer: Medicare Other

## 2019-12-15 DIAGNOSIS — R0600 Dyspnea, unspecified: Secondary | ICD-10-CM

## 2019-12-15 LAB — COMPREHENSIVE METABOLIC PANEL
ALT: 21 U/L (ref 0–44)
AST: 32 U/L (ref 15–41)
Albumin: 2.9 g/dL — ABNORMAL LOW (ref 3.5–5.0)
Alkaline Phosphatase: 91 U/L (ref 38–126)
Anion gap: 12 (ref 5–15)
BUN: 9 mg/dL (ref 8–23)
CO2: 21 mmol/L — ABNORMAL LOW (ref 22–32)
Calcium: 7.9 mg/dL — ABNORMAL LOW (ref 8.9–10.3)
Chloride: 104 mmol/L (ref 98–111)
Creatinine, Ser: 0.78 mg/dL (ref 0.44–1.00)
GFR calc Af Amer: >60 mL/min (ref >60)
GFR calc non Af Amer: >60 mL/min (ref >60)
Glucose, Bld: 85 mg/dL (ref 70–99)
Potassium: 3.1 mmol/L — ABNORMAL LOW (ref 3.5–5.1)
Sodium: 137 mmol/L (ref 135–145)
Total Bilirubin: 1.3 mg/dL — ABNORMAL HIGH (ref 0.3–1.2)
Total Protein: 5.7 g/dL — ABNORMAL LOW (ref 6.5–8.1)

## 2019-12-15 LAB — CBC WITH DIFFERENTIAL/PLATELET
Abs Immature Granulocytes: 0.02 10*3/uL (ref 0.00–0.07)
Basophils Absolute: 0 10*3/uL (ref 0.0–0.1)
Basophils Relative: 0 %
Eosinophils Absolute: 0 10*3/uL (ref 0.0–0.5)
Eosinophils Relative: 0 %
HCT: 30.6 % — ABNORMAL LOW (ref 36.0–46.0)
Hemoglobin: 10.6 g/dL — ABNORMAL LOW (ref 12.0–15.0)
Immature Granulocytes: 0 %
Lymphocytes Relative: 17 %
Lymphs Abs: 1.2 10*3/uL (ref 0.7–4.0)
MCH: 31.2 pg (ref 26.0–34.0)
MCHC: 34.6 g/dL (ref 30.0–36.0)
MCV: 90 fL (ref 80.0–100.0)
Monocytes Absolute: 0.7 10*3/uL (ref 0.1–1.0)
Monocytes Relative: 10 %
Neutro Abs: 5.3 10*3/uL (ref 1.7–7.7)
Neutrophils Relative %: 73 %
Platelets: 191 10*3/uL (ref 150–400)
RBC: 3.4 MIL/uL — ABNORMAL LOW (ref 3.87–5.11)
RDW: 12.8 % (ref 11.5–15.5)
WBC: 7.3 10*3/uL (ref 4.0–10.5)
nRBC: 0 % (ref 0.0–0.2)

## 2019-12-15 LAB — MAGNESIUM: Magnesium: 1.9 mg/dL (ref 1.7–2.4)

## 2019-12-15 LAB — D-DIMER, QUANTITATIVE: D-Dimer, Quant: 4 ug/mL-FEU — ABNORMAL HIGH (ref 0.00–0.50)

## 2019-12-15 LAB — PROCALCITONIN: Procalcitonin: <0.1 ng/mL

## 2019-12-15 LAB — BRAIN NATRIURETIC PEPTIDE: B Natriuretic Peptide: 33.7 pg/mL (ref 0.0–100.0)

## 2019-12-15 LAB — C-REACTIVE PROTEIN: CRP: 16 mg/dL — ABNORMAL HIGH (ref <1.0)

## 2019-12-15 MED ORDER — VITAMIN D 25 MCG (1000 UNIT) PO TABS
1000.0000 [IU] | ORAL_TABLET | Freq: Every day | ORAL | Status: DC
  Administered 2019-12-15 – 2019-12-17 (×3): 1000 [IU] via ORAL
  Filled 2019-12-15 (×2): qty 1

## 2019-12-15 MED ORDER — PROPRANOLOL HCL 10 MG PO TABS
10.0000 mg | ORAL_TABLET | Freq: Two times a day (BID) | ORAL | Status: DC
  Administered 2019-12-15 – 2019-12-17 (×4): 10 mg via ORAL
  Filled 2019-12-15 (×5): qty 1

## 2019-12-15 NOTE — Progress Notes (Signed)
Physical Therapy Treatment Patient Details Name: Jill Larson MRN: [id number] DOB: [birth year] Today's Date: 12/15/2019    History of Present Illness 79 year old female admitted 12/12/19 presenting with progressive decline in the last weeks with excessive fatigue, sleepiness and mild confusion per daughter's report. In ER, patient noted with mild tremors. CT head nonacute. Patient with altered mental status. She recently started medication for depression. 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 EEG negative for seizures. MRI brain ordered. 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 Unable to complete remote psych evaluation 12/14/19. Patient also with diarrhea and +COVID. PMH: depression, HTN    PT Comments    Patient more alert, oriented and able to converse today, makes her needs known. She is oriented to her name and that she is in a medical center but names wrong facility. Disoriented to month and year (Nov 2020). Disoriented to situation (thinks she brought her grandson to the hospital). Patient does report that her daughter has been home with her since [place] but that her daughter is a CNA by training. She also notes that she had back surgery and has a RW from after her back surgery. She also reports she went to rehab for a month after her back surgery. Use of BSC twice during session with minA. Improved mobility with use of RW compared to no AD. Patient still tremulous, although improved since yesterday. Too tremulous to attempt forward ambulation in room today (unless had close chair follow). If patient is less tremulous tomorrow, may attempt. Changed finger probe twice during session for improved SpO2 reading/accuracy. Patient on room air. SpO2 98%, HR 99 bpm at end of session.  Continued recommendation for skilled PT services and discharges to SNF for short term rehabilitation unless patient's cognition and mobility improve during hospitalization to the degree that  she could safely discharge back home with her daugther.   Follow Up Recommendations  SNF;Supervision/Assistance - 24 hour;Supervision for mobility/OOB     Equipment Recommendations  Rolling walker with 5" wheels;3in1 (PT)       Precautions / Restrictions Precautions Precautions: Fall Precaution Comments: impaired cognition, impaired safety, foley catheter Restrictions Weight Bearing Restrictions: No    Mobility  Bed Mobility Overal bed mobility: Needs Assistance Bed Mobility: Supine to Sit;Sit to Supine(x 2 trials during session)     Supine to sit: Min assist Sit to supine: Min assist   General bed mobility comments: HOB elevated for supine>sit  Transfers Overall transfer level: Needs assistance Equipment used: Rolling walker (2 wheeled);None Transfers: Sit to/from [employer] to Stand: Min assist Stand pivot transfers: Min assist       General transfer comment: Stand-step transfer EOB>BSC with minA trial 1. Sit>stand from [facility] with RW and gait belt trial 2 with minA from PT and CNA assisted patient with hygiene. Patient ambulated a few feet to the bed and side step along EOB with RW and minA. Once back in bed, patient required use of BSC 2nd time so assisted patient stand-step transfer with RW and gait belt to [facility] again. MinA for sit<>stand from EOB with RW and gait belt at end of session for pre-gait marching in place.  Ambulation/Gait Ambulation/Gait assistance: Min assist Gait Distance (Feet): 4 Feet(including lateral stepping along EOB, pre-gait marching ) Assistive device: Rolling walker (2 wheeled)       General Gait Details: Tremulous still so forward ambulation in room not attempted today. Possibly will attempt tomorrow if patient less tremulous.  Balance Overall balance assessment: Needs assistance   Sitting balance-Leahy Scale: Good     Standing balance support: No upper extremity supported;Bilateral upper extremity  supported;Single extremity supported Standing balance-Leahy Scale: Poor Standing balance comment: Progressing to cl supervision/CGA static standing balance with use of RW toward end of session.      Cognition Arousal/Alertness: Awake/alert Behavior During Therapy: Impulsive Overall Cognitive Status: Impaired/Different from baseline Area of Impairment: Orientation;Safety/judgement      Orientation Level: Disoriented to;Place;Time;Situation(oriented to medical center but not which one)       Safety/Judgement: Decreased awareness of safety;Decreased awareness of deficits     General Comments: More alert today, able to converse, able to make needs known.             Pertinent Vitals/Pain Pain Assessment: No/denies pain           PT Goals (current goals can now be found in the care plan section) Progress towards PT goals: Progressing toward goals    Frequency    Min 3X/week      PT Plan Current plan remains appropriate       AM-PAC PT "6 Clicks" Mobility   Outcome Measure  Help needed turning from your back to your side while in a flat bed without using bedrails?: A Little Help needed moving from lying on your back to sitting on the side of a flat bed without using bedrails?: A Little Help needed moving to and from a bed to a chair (including a wheelchair)?: A Little Help needed standing up from a chair using your arms (e.g., wheelchair or bedside chair)?: A Little Help needed to walk in hospital room?: A Lot Help needed climbing 3-5 steps with a railing? : A Lot 6 Click Score: 16    End of Session Equipment Utilized During Treatment: Gait belt Activity Tolerance: Patient tolerated treatment well Patient left: in bed;with call bell/phone within reach;with bed alarm set Nurse Communication: Mobility status(Elimination) PT Visit Diagnosis: Unsteadiness on feet (R26.81);Other abnormalities of gait and mobility (R26.89);Muscle weakness (generalized) (M62.81)      Time: [id number] PT Time Calculation (min) (ACUTE ONLY): 47 min  Charges:  $Therapeutic Activity: 38-52 mins                     Birdie Hopes, DPT, PT Acute Rehab [phone]     Birdie Hopes 12/15/2019, 1:29 PM

## 2019-12-15 NOTE — TOC Initial Note (Addendum)
Transition of Care [facility]) - Initial/Assessment Note    Patient Details  Name: Jill Larson MRN: [id number] Date of Birth: April 03, 1941  Transition of [facility]) CM/SW Contact:    Ralph Dowdy, [place] Work Phone Number: 12/15/2019, 4:40 PM  Clinical Narrative:                 MSW received consult for possible SNF placement at time of discharge. MSW spoke with patient's daughter, [name] [phone]) regarding PT recommendation of SNF placement at time of discharge. Pt is currently confused and disoriented at this time, so I was unable to speak to her about rehab at this time. Santiago Glad reported that she is currently unable to care for patient at their home given patient's current physical needs and fall risk. Santiago Glad expressed understanding of PT recommendation and is agreeable to SNF placement at time of discharge. Patient reports preference for [facility] since it is close to [place], and she is familiar with the facility. MSW discussed insurance authorization process and provided Medicare SNF ratings list, along with each facility that will take COVID + patients. Santiago Glad expressed being hopeful for pt's rehab and to feel better soon. No further questions reported at this time. CSW will continue to follow and assist with discharge planning needs.  Expected Discharge Plan: Skilled Nursing Facility Barriers to Discharge: Continued Medical Work up   Patient Goals and CMS Choice Patient states their goals for this hospitalization and ongoing recovery are:: To get better enough to go home. CMS Medicare.gov Compare Post Acute Care list provided to:: Other (Comment Required)(Karen Cyndi Bender, Daughter) Choice offered to / list presented to : Adult Children  Expected Discharge Plan and Services Expected Discharge Plan: [place] In-house Referral: Clinical Social Work Discharge Planning Services: CM Consult Post Acute Care Choice: [place]  arrangements for the past 2 months: [place]                                      Prior Living Arrangements/Services Living arrangements for the past 2 months: Single Family Home Lives with:: Self, Adult Children Patient language and need for interpreter reviewed:: Yes Do you feel safe going back to the place where you live?: Yes      Need for Family Participation in Patient Care: Yes (Comment) Care giver support system in place?: Yes (comment)   Criminal Activity/Legal Involvement Pertinent to Current Situation/Hospitalization: No - Comment as needed  Activities of Daily Living      Permission Sought/Granted Permission sought to share information with : Case Manager Permission granted to share information with : Yes, Release of Information Signed  Share Information with NAME: [name], Daughter           Emotional Assessment Appearance:: Other (Comment Required(Not able to assess patient in person) Attitude/Demeanor/Rapport: Gracious Affect (typically observed): Accepting, Stable Orientation: : Oriented to Self Alcohol / Substance Use: Not Applicable Psych Involvement: No (comment)  Admission diagnosis:  Altered mental status [R41.82] Acute encephalopathy [G93.40] Altered mental status, unspecified altered mental status type [R41.82] Serotonin syndrome [G25.79] Patient Active Problem List   Diagnosis Date Noted  . Altered mental status 12/13/2019  . Acute encephalopathy 12/13/2019  . 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  . Chronic LBP 03/11/2015  . Clinical depression 03/11/2015  . 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 Pulmonary embolism ([place]) 03/11/2015  . SPL (spondylolisthesis) 03/11/2015  .  OTHER DISEASES OF LUNG NOT ELSEWHERE CLASSIFIED 10/30/2009  . GASTRIC POLYP 06/19/2008  . PALPITATIONS 06/14/2008  . HYPERCHOLESTEROLEMIA 04/18/2008  . ANXIETY 04/18/2008  . HYPERTENSION, BORDERLINE 04/18/2008  .  CONSTIPATION 04/18/2008  . BACK PAIN, LUMBAR 04/18/2008  . 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 OSTEOPOROSIS 04/18/2008  . MITRAL VALVE PROLAPSE, HX OF 04/18/2008   PCP:  Imagene Riches, NP Pharmacy:   [facility], [place] [address] Phone: [phone] Fax: [phone]     Social Determinants of Health (SDOH) Interventions    Readmission Risk Interventions No flowsheet data found.

## 2019-12-15 NOTE — Plan of Care (Signed)

## 2019-12-15 NOTE — Plan of Care (Signed)
Attempted to see the patient.  Off the floor for MRI. We will revisit tomorrow.  -- Amie [place], MD [facility] Pager: [phone] If 7pm to 7am, please call on call as listed on AMION.

## 2019-12-15 NOTE — Consult Note (Signed)
Telepsych Consultation   Reason for Consult: "Seratonin syndrome, change home Psych Meds"  Referring Physician:  Dr Waldron Labs Location of [facility]: [facility] [id number] Location of Provider: [facility]  Patient Identification: NUR GOSSMAN MRN:  [id number] Principal Diagnosis: Altered mental status Diagnosis:  Principal Problem:   Altered mental status Active Problems:   Acute encephalopathy   Serotonin syndrome   Total Time spent with patient: 30 minutes  Subjective:   MAIRYN POLHAMUS is a 79 y.o. female patient admitted with increased fatigue and sleeping.  Patient assessed by nurse practitioner along with Dr. Dwyane Dee.  Patient alert for interview.  Patient oriented to self only at this time.  Patient reports that it is currently December 2001 and she came to the hospital because her husband was having a stroke."  Patient admitted inpatient x3 days, patient states "this is my first day here." Patient denies suicidal and homicidal ideations.  Patient denies history of self-harm.  Patient denies auditory and visual hallucinations. Patient gives verbal consent to speak with her daughter [name] phone number [phone]. Spoke with patient's daughter Santiago Glad: Santiago Glad denies concerns for patient safety.  Santiago Glad denies any history of suicidal or homicidal ideations.  Santiago Glad denies patient has access to weapons.  Patient lives with daughter son-in-law and grandson. Santiago Glad verbalizes concerns for patient's medications.  Patient's daughter assist with patient's medication compliance 5 placing medications in a pillbox.  Santiago Glad states "a couple of months ago she was sleeping a lot and not motivated, the doctor started her on Effexor."  Per Santiago Glad prior to admission patient was "having difficulty getting her sentences out."  Per daughter patient's husband passed away in 07-27-2019.  Patient currently seen by outpatient psychiatry at [facility] psychiatric services.  HPI: Patient admitted with  increased fatigue and sleeping.  Past Psychiatric History: Depression  Risk to Self:  Denies Risk to Others:  Denies Prior Inpatient Therapy:  Denies Prior Outpatient Therapy:  Yes  Past Medical History:  Past Medical History:  Diagnosis Date  . Anxiety   . Benign fundic gland polyps of stomach 2001  . DDD (degenerative disc disease)   . Depression   . Depression   . Fibromyalgia   . Hyperlipidemia   . Hypertension   . Mitral valve prolapse   . Osteoporosis   . Scoliosis     Past Surgical History:  Procedure Laterality Date  . BACK SURGERY    . BREAST EXCISIONAL BIOPSY Right pt unsure  . BREAST SURGERY     right to remove calcium deposits  . TONSILLECTOMY AND ADENOIDECTOMY    . TUBAL LIGATION     Family History:  Family History  Problem Relation Age of Onset  . Hypertension Father   . Pancreatic cancer Sister   . Colon cancer Neg Hx    Family Psychiatric  History: Denies Social History:  Social History   Substance and Sexual Activity  Alcohol Use Yes   Comment: Social     Social History   Substance and Sexual Activity  Drug Use No    Social History   Socioeconomic History  . Marital status: Married    Spouse name: Not on file  . Number of children: 2  . Years of education: Not on file  . Highest education level: Not on file  Occupational History  . Occupation: Retired Licensed conveyancer  Tobacco Use  . Smoking status: Former Smoker    Types: Cigarettes  . Smokeless tobacco: Never Used  Substance and Sexual Activity  .  Alcohol use: Yes    Comment: Social  . Drug use: No  . Sexual activity: Not on file  Other Topics Concern  . Not on file  Social History Narrative  . Not on file   Social Determinants of Health   Financial Resource Strain:   . Difficulty of Paying Living Expenses: Not on file  Food Insecurity:   . Worried About Charity fundraiser in the Last Year: Not on file  . Ran Out of Food in the Last Year: Not on file  Transportation  Needs:   . Lack of Transportation (Medical): Not on file  . Lack of Transportation (Non-Medical): Not on file  Physical Activity:   . Days of Exercise per Week: Not on file  . Minutes of Exercise per Session: Not on file  Stress:   . Feeling of Stress : Not on file  Social Connections:   . Frequency of Communication with Friends and Family: Not on file  . Frequency of Social Gatherings with Friends and Family: Not on file  . Attends Religious Services: Not on file  . Active Member of Clubs or Organizations: Not on file  . Attends Archivist Meetings: Not on file  . Marital Status: Not on file   Additional Social History:    Allergies:  No Known Allergies  Labs:  Results for orders placed or performed during the hospital encounter of 12/12/19 (from the past 48 hour(s))  Procalcitonin - Baseline     Status: None   Collection Time: 12/13/19  3:53 PM  Result Value Ref Range   Procalcitonin <0.10 ng/mL    Comment:        Interpretation: PCT (Procalcitonin) <= 0.5 ng/mL: Systemic infection (sepsis) is not likely. Local bacterial infection is possible. (NOTE)       Sepsis PCT Algorithm           Lower Respiratory Tract                                      Infection PCT Algorithm    ----------------------------     ----------------------------         PCT < 0.25 ng/mL                PCT < 0.10 ng/mL         Strongly encourage             Strongly discourage   discontinuation of antibiotics    initiation of antibiotics    ----------------------------     -----------------------------       PCT 0.25 - 0.50 ng/mL            PCT 0.10 - 0.25 ng/mL               OR       >80% decrease in PCT            Discourage initiation of                                            antibiotics      Encourage discontinuation           of antibiotics    ----------------------------     -----------------------------         PCT >=  0.50 ng/mL              PCT 0.26 - 0.50 ng/mL                AND        <80% decrease in PCT             Encourage initiation of                                             antibiotics       Encourage continuation           of antibiotics    ----------------------------     -----------------------------        PCT >= 0.50 ng/mL                  PCT > 0.50 ng/mL               AND         increase in PCT                  Strongly encourage                                      initiation of antibiotics    Strongly encourage escalation           of antibiotics                                     -----------------------------                                           PCT <= 0.25 ng/mL                                                 OR                                        > 80% decrease in PCT                                     Discontinue / Do not initiate                                             antibiotics Performed at [facility], [address] [place], [place] [address]   Comprehensive metabolic panel     Status: Abnormal   Collection Time: 12/14/19  6:15 AM  Result Value Ref Range   Sodium 136 135 - 145 mmol/L   Potassium 3.7 3.5 - 5.1 mmol/L   Chloride 103 98 - 111 mmol/L   CO2 22 22 -  32 mmol/L   Glucose, Bld 109 (H) 70 - 99 mg/dL   BUN 10 8 - 23 mg/dL   Creatinine, Ser 0.97 0.44 - 1.00 mg/dL   Calcium 8.2 (L) 8.9 - 10.3 mg/dL   Total Protein 6.2 (L) 6.5 - 8.1 g/dL   Albumin 3.2 (L) 3.5 - 5.0 g/dL   AST 31 15 - 41 U/L   ALT 19 0 - 44 U/L   Alkaline Phosphatase 111 38 - 126 U/L   Total Bilirubin 0.8 0.3 - 1.2 mg/dL   GFR calc non Af Amer 56 (L) >60 mL/min   GFR calc Af Amer >60 >60 mL/min   Anion gap 11 5 - 15    Comment: Performed at [facility] [address], [place], [place] [address]  CBC with Differential/Platelet     Status: None   Collection Time: 12/14/19  6:15 AM  Result Value Ref Range   WBC 8.6 4.0 - 10.5 K/uL   RBC 4.04 3.87 - 5.11 MIL/uL   Hemoglobin 12.4 12.0 - 15.0 g/dL   HCT 36.4 36.0 -  46.0 %   MCV 90.1 80.0 - 100.0 fL   MCH 30.7 26.0 - 34.0 pg   MCHC 34.1 30.0 - 36.0 g/dL   RDW 12.7 11.5 - 15.5 %   Platelets 202 150 - 400 K/uL   nRBC 0.0 0.0 - 0.2 %   Neutrophils Relative % 76 %   Neutro Abs 6.6 1.7 - 7.7 K/uL   Lymphocytes Relative 14 %   Lymphs Abs 1.2 0.7 - 4.0 K/uL   Monocytes Relative 10 %   Monocytes Absolute 0.9 0.1 - 1.0 K/uL   Eosinophils Relative 0 %   Eosinophils Absolute 0.0 0.0 - 0.5 K/uL   Basophils Relative 0 %   Basophils Absolute 0.0 0.0 - 0.1 K/uL   Immature Granulocytes 0 %   Abs Immature Granulocytes 0.02 0.00 - 0.07 K/uL    Comment: Performed at [facility], [address] [place], [place] [address]  Brain natriuretic peptide     Status: None   Collection Time: 12/14/19  6:15 AM  Result Value Ref Range   B Natriuretic Peptide 22.3 0.0 - 100.0 pg/mL    Comment: Performed at [facility] [address], [place], [place] [address]  C-reactive protein     Status: Abnormal   Collection Time: 12/14/19  6:15 AM  Result Value Ref Range   CRP 7.2 (H) <1.0 mg/dL    Comment: Performed at [facility] [address], [place], [place] [address]  D-dimer, quantitative (not at [facility])     Status: Abnormal   Collection Time: 12/14/19  6:15 AM  Result Value Ref Range   D-Dimer, Quant 3.27 (H) 0.00 - 0.50 ug/mL-FEU    Comment: (NOTE) At the manufacturer cut-off of 0.50 ug/mL FEU, this assay has been documented to exclude PE with a sensitivity and negative predictive value of 97 to 99%.  At this time, this assay has not been approved by the FDA to exclude DVT/VTE. Results should be correlated with clinical presentation. Performed at [facility], [place] [address] [place], [place] [address]   Magnesium     Status: None   Collection Time: 12/14/19  6:15 AM  Result Value Ref Range   Magnesium 1.8 1.7 - 2.4 mg/dL    Comment: Performed at [facility] [address] [place], [place] [address]  Procalcitonin  Status:  None   Collection Time: 12/14/19  6:15 AM  Result Value Ref Range   Procalcitonin <0.10 ng/mL    Comment:        Interpretation: PCT (Procalcitonin) <= 0.5 ng/mL: Systemic infection (sepsis) is not likely. Local bacterial infection is possible. (NOTE)       Sepsis PCT Algorithm           Lower Respiratory Tract                                      Infection PCT Algorithm    ----------------------------     ----------------------------         PCT < 0.25 ng/mL                PCT < 0.10 ng/mL         Strongly encourage             Strongly discourage   discontinuation of antibiotics    initiation of antibiotics    ----------------------------     -----------------------------       PCT 0.25 - 0.50 ng/mL            PCT 0.10 - 0.25 ng/mL               OR       >80% decrease in PCT            Discourage initiation of                                            antibiotics      Encourage discontinuation           of antibiotics    ----------------------------     -----------------------------         PCT >= 0.50 ng/mL              PCT 0.26 - 0.50 ng/mL               AND        <80% decrease in PCT             Encourage initiation of                                             antibiotics       Encourage continuation           of antibiotics    ----------------------------     -----------------------------        PCT >= 0.50 ng/mL                  PCT > 0.50 ng/mL               AND         increase in PCT                  Strongly encourage                                      initiation of antibiotics    Strongly  encourage escalation           of antibiotics                                     -----------------------------                                           PCT <= 0.25 ng/mL                                                 OR                                        > 80% decrease in PCT                                     Discontinue / Do not initiate                                              antibiotics Performed at [facility], [place] [address], [place], [place] [address]   Comprehensive metabolic panel     Status: Abnormal   Collection Time: 12/15/19  5:41 AM  Result Value Ref Range   Sodium 137 135 - 145 mmol/L   Potassium 3.1 (L) 3.5 - 5.1 mmol/L   Chloride 104 98 - 111 mmol/L   CO2 21 (L) 22 - 32 mmol/L   Glucose, Bld 85 70 - 99 mg/dL    Comment: Glucose reference range applies only to samples taken after fasting for at least 8 hours.   BUN 9 8 - 23 mg/dL   Creatinine, Ser 0.78 0.44 - 1.00 mg/dL   Calcium 7.9 (L) 8.9 - 10.3 mg/dL   Total Protein 5.7 (L) 6.5 - 8.1 g/dL   Albumin 2.9 (L) 3.5 - 5.0 g/dL   AST 32 15 - 41 U/L   ALT 21 0 - 44 U/L   Alkaline Phosphatase 91 38 - 126 U/L   Total Bilirubin 1.3 (H) 0.3 - 1.2 mg/dL   GFR calc non Af Amer >60 >60 mL/min   GFR calc Af Amer >60 >60 mL/min   Anion gap 12 5 - 15    Comment: Performed at [facility] [address], [place], [place] [address]  CBC with Differential/Platelet     Status: Abnormal   Collection Time: 12/15/19  5:41 AM  Result Value Ref Range   WBC 7.3 4.0 - 10.5 K/uL   RBC 3.40 (L) 3.87 - 5.11 MIL/uL   Hemoglobin 10.6 (L) 12.0 - 15.0 g/dL   HCT 30.6 (L) 36.0 - 46.0 %   MCV 90.0 80.0 - 100.0 fL   MCH 31.2 26.0 - 34.0 pg   MCHC 34.6 30.0 - 36.0 g/dL   RDW 12.8 11.5 - 15.5 %   Platelets 191 150 - 400 K/uL  nRBC 0.0 0.0 - 0.2 %   Neutrophils Relative % 73 %   Neutro Abs 5.3 1.7 - 7.7 K/uL   Lymphocytes Relative 17 %   Lymphs Abs 1.2 0.7 - 4.0 K/uL   Monocytes Relative 10 %   Monocytes Absolute 0.7 0.1 - 1.0 K/uL   Eosinophils Relative 0 %   Eosinophils Absolute 0.0 0.0 - 0.5 K/uL   Basophils Relative 0 %   Basophils Absolute 0.0 0.0 - 0.1 K/uL   Immature Granulocytes 0 %   Abs Immature Granulocytes 0.02 0.00 - 0.07 K/uL    Comment: Performed at [facility] [address], [place], [place] [address]  Brain natriuretic peptide     Status: None    Collection Time: 12/15/19  5:41 AM  Result Value Ref Range   B Natriuretic Peptide 33.7 0.0 - 100.0 pg/mL    Comment: Performed at [facility] [address] [place], [place] [address]  C-reactive protein     Status: Abnormal   Collection Time: 12/15/19  5:41 AM  Result Value Ref Range   CRP 16.0 (H) <1.0 mg/dL    Comment: Performed at [facility] [address], [place], [place] [address]  D-dimer, quantitative (not at [facility])     Status: Abnormal   Collection Time: 12/15/19  5:41 AM  Result Value Ref Range   D-Dimer, Quant 4.00 (H) 0.00 - 0.50 ug/mL-FEU    Comment: (NOTE) At the manufacturer cut-off of 0.50 ug/mL FEU, this assay has been documented to exclude PE with a sensitivity and negative predictive value of 97 to 99%.  At this time, this assay has not been approved by the FDA to exclude DVT/VTE. Results should be correlated with clinical presentation. Performed at [facility], [place] [address], [place], [place] [address]   Magnesium     Status: None   Collection Time: 12/15/19  5:41 AM  Result Value Ref Range   Magnesium 1.9 1.7 - 2.4 mg/dL    Comment: Performed at [facility] [address], [place],  [address]  Procalcitonin     Status: None   Collection Time: 12/15/19  5:41 AM  Result Value Ref Range   Procalcitonin <0.10 ng/mL    Comment:        Interpretation: PCT (Procalcitonin) <= 0.5 ng/mL: Systemic infection (sepsis) is not likely. Local bacterial infection is possible. (NOTE)       Sepsis PCT Algorithm           Lower Respiratory Tract                                      Infection PCT Algorithm    ----------------------------     ----------------------------         PCT < 0.25 ng/mL                PCT < 0.10 ng/mL         Strongly encourage             Strongly discourage   discontinuation of antibiotics    initiation of antibiotics    ----------------------------     -----------------------------       PCT 0.25 - 0.50  ng/mL            PCT 0.10 - 0.25 ng/mL  OR       >80% decrease in PCT            Discourage initiation of                                            antibiotics      Encourage discontinuation           of antibiotics    ----------------------------     -----------------------------         PCT >= 0.50 ng/mL              PCT 0.26 - 0.50 ng/mL               AND        <80% decrease in PCT             Encourage initiation of                                             antibiotics       Encourage continuation           of antibiotics    ----------------------------     -----------------------------        PCT >= 0.50 ng/mL                  PCT > 0.50 ng/mL               AND         increase in PCT                  Strongly encourage                                      initiation of antibiotics    Strongly encourage escalation           of antibiotics                                     -----------------------------                                           PCT <= 0.25 ng/mL                                                 OR                                        > 80% decrease in PCT                                     Discontinue / Do not initiate  antibiotics Performed at [facility], [place] [address], [place], [place] [address]     Medications:  Current Facility-Administered Medications  Medication Dose Route Frequency Provider Last Rate Last Admin  . acetaminophen (TYLENOL) tablet 650 mg  650 mg Oral Q6H PRN Etta Quill, DO       Or  . acetaminophen (TYLENOL) suppository 650 mg  650 mg Rectal Q6H PRN Etta Quill, DO   650 mg at 12/14/19 0310  . albuterol (VENTOLIN HFA) 108 (90 Base) MCG/ACT inhaler 2 puff  2 puff Inhalation Q6H PRN Thurnell Lose, MD      . cefTRIAXone (ROCEPHIN) 1 g in sodium chloride 0.9 % 100 mL IVPB  1 g Intravenous Q24H Lala Lund K, MD 200 mL/hr at 12/15/19 1123 1 g at 12/15/19  1123  . Chlorhexidine Gluconate Cloth 2 % PADS 6 each  6 each Topical Daily Thurnell Lose, MD   6 each at 12/15/19 1019  . enoxaparin (LOVENOX) injection 40 mg  40 mg Subcutaneous Q12H Thurnell Lose, MD   40 mg at 12/15/19 0836  . LORazepam (ATIVAN) injection 0.5 mg  0.5 mg Intravenous Q6H PRN Thurnell Lose, MD      . ondansetron [facility]) tablet 4 mg  4 mg Oral Q6H PRN Etta Quill, DO       Or  . ondansetron [facility]) injection 4 mg  4 mg Intravenous Q6H PRN Etta Quill, DO      . tamsulosin [facility]) capsule 0.4 mg  0.4 mg Oral Daily Thurnell Lose, MD   0.4 mg at 12/15/19 [id number]    Musculoskeletal: Strength & Muscle Tone: Unable to assess Gait & Station: Unable to assess Patient leans: Unable to assess  Psychiatric Specialty Exam: Physical Exam  Nursing note and vitals reviewed. Constitutional: She appears well-developed.  HENT:  Head: Normocephalic.  Cardiovascular: Normal rate.  Respiratory: Effort normal.  Musculoskeletal:        General: Normal range of motion.     Cervical back: Normal range of motion.  Neurological: She is alert.  Psychiatric: She has a normal mood and affect. Her speech is normal and behavior is normal. Judgment and thought content normal. Cognition and memory are impaired.    Review of Systems  Constitutional: Negative.   HENT: Negative.   Eyes: Negative.   Respiratory: Negative.   Cardiovascular: Negative.   Gastrointestinal: Negative.   Genitourinary: Negative.   Musculoskeletal: Negative.   Skin: Negative.   Neurological: Negative.   Psychiatric/Behavioral: Positive for confusion.    Blood pressure 138/81, pulse 89, temperature 98.9 F (37.2 C), temperature source Oral, resp. rate 20, height 5\' 4"  (1.626 m), weight 54.4 kg, SpO2 97 %.Body mass index is 20.6 kg/m.  General Appearance: Casual  Eye Contact:  Fair  Speech:  Clear and Coherent and Normal Rate  Volume:  Normal  Mood:  Euthymic  Affect:  Congruent  Thought  Process:  Coherent, Goal Directed and Descriptions of Associations: Intact  Orientation:  Other:  self only  Thought Content:  Logical  Suicidal Thoughts:  No  Homicidal Thoughts:  No  Memory:  Immediate;   Poor Recent;   Poor Remote;   Fair  Judgement:  Fair  Insight:  Lacking  Psychomotor Activity:  Normal  Concentration:  Concentration: Fair and Attention Span: Fair  Recall:  [employer] of Knowledge:  Good  Language:  Good  Akathisia:  No  Handed:  Right  AIMS (if indicated):  Assets:  Communication Skills Desire for Improvement Financial Resources/Insurance Housing Intimacy Leisure Time Physical Health  ADL's:  Intact  Cognition:  Impaired,  Moderate  Sleep:        Treatment Plan Summary: Case discussed with Dr Dwyane Dee Medication management  Recommend consider Discontinue  Antidepressant medications. Continue benzodiazepine medications. Follow up with outpatient psychiatry in two weeks to address medications for depression.   Disposition: No evidence of imminent risk to self or others at present.   Patient does not meet criteria for psychiatric inpatient admission.  This service was provided via telemedicine using a 2-way, interactive audio and video technology.  Names of all persons participating in this telemedicine service and their role in this encounter. Name: Adair Laundry Role: Patient  Name: Kaleen Mask telephone Role: Patient's daughter  Name: Letitia Libra Role: [place], [place] 12/15/2019 12:37 PM

## 2019-12-15 NOTE — NC FL2 (Addendum)
[place] LEVEL OF CARE SCREENING TOOL     IDENTIFICATION  Patient Name: Jill Larson Birthdate: December 19, 1940 Sex: female Admission Date (Current Location): 12/12/2019  [facility] and [place] Number:  Herbalist and Address:  [place]. [facility], [place] [address] [place], [place] [address]      Provider Number: [id number]  Attending Physician Name and Address:  Elgergawy, Silver Huguenin, MD  Relative Name and Phone Number:  Gentry Roch, Daughter [phone]    Current Level of Care: Hospital Recommended Level of Care: [place] Prior Approval Number:    Date Approved/Denied:   PASRR Number: [id number]  Discharge Plan: SNF    Current Diagnoses: Patient Active Problem List   Diagnosis Date Noted  . Altered mental status 12/13/2019  . Acute encephalopathy 12/13/2019  . 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  . Chronic LBP 03/11/2015  . Clinical depression 03/11/2015  . Intestinal obstruction due to decreased peristalsis ([place]) 03/11/2015  . Pulmonary embolism ([place]) 03/11/2015  . SPL (spondylolisthesis) 03/11/2015  . OTHER DISEASES OF LUNG NOT ELSEWHERE CLASSIFIED 10/30/2009  . GASTRIC POLYP 06/19/2008  . PALPITATIONS 06/14/2008  . HYPERCHOLESTEROLEMIA 04/18/2008  . ANXIETY 04/18/2008  . HYPERTENSION, BORDERLINE 04/18/2008  . CONSTIPATION 04/18/2008  . BACK PAIN, LUMBAR 04/18/2008  . 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 OSTEOPOROSIS 04/18/2008  . MITRAL VALVE PROLAPSE, HX OF 04/18/2008    Orientation RESPIRATION BLADDER Height & Weight     Self  Normal Incontinent, External catheter Weight: 120 lb (54.4 kg) Height:  5\' 4"  (162.6 cm)  BEHAVIORAL SYMPTOMS/MOOD NEUROLOGICAL BOWEL NUTRITION STATUS  Other (Comment)(no behavioral symptoms or moods)   Incontinent Diet(see dc summary)  AMBULATORY STATUS COMMUNICATION OF NEEDS Skin   Extensive Assist Verbally Normal, Other (Comment)(Pale, dry, intact)                       Personal  Care Assistance Level of Assistance  Bathing, Feeding, Dressing Bathing Assistance: Maximum assistance Feeding assistance: Limited assistance Dressing Assistance: Limited assistance     Functional Limitations Info  Sight, Speech, Hearing Sight Info: Impaired Hearing Info: Adequate Speech Info: Adequate    SPECIAL CARE FACTORS FREQUENCY  OT (By licensed OT), PT (By licensed PT)     PT Frequency: 5x OT Frequency: 4x            Contractures Contractures Info: Not present    Additional Factors Info  Code Status, Allergies, Isolation Precautions Code Status Info: FULL Allergies Info: NKA     Isolation Precautions Info: covid +     Current Medications (12/15/2019):  This is the current hospital active medication list Current Facility-Administered Medications  Medication Dose Route Frequency Provider Last Rate Last Admin  . acetaminophen (TYLENOL) tablet 650 mg  650 mg Oral Q6H PRN Etta Quill, DO       Or  . acetaminophen (TYLENOL) suppository 650 mg  650 mg Rectal Q6H PRN Etta Quill, DO   650 mg at 12/14/19 0310  . albuterol (VENTOLIN HFA) 108 (90 Base) MCG/ACT inhaler 2 puff  2 puff Inhalation Q6H PRN Thurnell Lose, MD      . cefTRIAXone (ROCEPHIN) 1 g in sodium chloride 0.9 % 100 mL IVPB  1 g Intravenous Q24H Lala Lund K, MD 200 mL/hr at 12/15/19 1123 1 g at 12/15/19 1123  . Chlorhexidine Gluconate Cloth 2 % PADS 6 each  6 each Topical Daily Thurnell Lose, MD   6 each at 12/15/19 1019  .  cholecalciferol (VITAMIN D3) tablet 1,000 Units  1,000 Units Oral Daily Elgergawy, Dawood S, MD      . enoxaparin (LOVENOX) injection 40 mg  40 mg Subcutaneous Q12H Thurnell Lose, MD   40 mg at 12/15/19 0836  . LORazepam (ATIVAN) injection 0.5 mg  0.5 mg Intravenous Q6H PRN Thurnell Lose, MD   0.5 mg at 12/15/19 1407  . ondansetron (ZOFRAN) tablet 4 mg  4 mg Oral Q6H PRN Etta Quill, DO       Or  . ondansetron [facility]) injection 4 mg  4 mg Intravenous  Q6H PRN Etta Quill, DO      . propranolol (INDERAL) tablet 10 mg  10 mg Oral BID Elgergawy, Silver Huguenin, MD      . tamsulosin (FLOMAX) capsule 0.4 mg  0.4 mg Oral Daily Thurnell Lose, MD   0.4 mg at 12/15/19 [id number]     Discharge Medications: Please see discharge summary for a list of discharge medications.  Relevant Imaging Results:  Relevant Lab Results:   Additional Information SSN: [id number]  Ralph Dowdy, Student-Social Work

## 2019-12-15 NOTE — Progress Notes (Signed)
PROGRESS NOTE                                                                                                                                                                                                             Patient Demographics:    Jill Larson, is a 79 y.o. female, DOB - 1941-09-13, [id number]  Outpatient Primary MD for the patient is Imagene Riches, NP    LOS - 2  Admit date - 12/12/2019    Chief Complaint  Patient presents with  . Altered Mental Status       Brief Narrative  79 year old frail elderly Caucasian female with history of depression and hypertension was brought in by her daughter for evaluation of progressive mental decline with excessive fatigue, sleepiness and mild confusion.  In the ER she was found to have mild tremors, confusion, CT head was nonacute, she was seen by neurology and hospitalist team was requested to admit for possible serotonin syndrome.   Subjective:   Reports she is feeling better today, she herself still acknowledges that her mentation is back to baseline, he herself denies any focal deficits, tingling or numbness .   Assessment  & Plan :    Acute Covid 19 Viral infection  -Chest x-ray on admission with no new findings, but her CRP and D-dimers trending up today, as well she does report some dyspnea, I will repeat her chest x-ray, and will start on remdesivir if she has any acute findings. -We will hold on steroids as no hypoxia - Encouraged the patient to sit up in chair in the daytime use I-S and flutter valve for pulmonary toiletry and then prone in bed when at night.     SpO2: 97 %  Recent Labs  Lab 12/13/19 0232 12/13/19 1553 12/14/19 0615 12/15/19 0541  CRP  --   --  7.2* 16.0*  DDIMER  --   --  3.27* 4.00*  BNP  --   --  22.3 33.7  PROCALCITON  --  <0.10 <0.10 <0.10  SARSCOV2NAA POSITIVE*  --   --   --       2.  Metabolic encephalopathy most  likely serotonin syndrome.  Hold offending medications, mentation appears to improve, continue with current as needed benzodiazepine dosing . -Psychiatry consult greatly appreciated, continue to hold Zyprexa, Effexor and Lexapro, continue  with benzodiazepine only. - Neurology input greatly appreciated, MRI with no acute findings, no focal deficits, head CT and EEG nonacute.    3.  Hypertension.  Resume home dose propranolol  4.  Acute urinary retention.  Could be due to serotonin syndrome as well    Condition - Extremely Guarded  Family Communication  : Tried to reach daughter, unable to leave a voicemail  Code Status : Full  Diet :   Diet Order            DIET SOFT Room service appropriate? Yes; Fluid consistency: Thin  Diet effective now               Disposition Plan  : Stay in the hospital, getting treatment for metabolic encephalopathy, serotonin syndrome  Consults  : Neurology, psychiatry  Procedures  :    CT head.  Nonacute  EEG.  Nonspecific changes no seizure focus.  RI brain.  Pending  PUD Prophylaxis : None  DVT Prophylaxis  :  Lovenox   Lab Results  Component Value Date   PLT 191 12/15/2019    Inpatient Medications  Scheduled Meds: . Chlorhexidine Gluconate Cloth  6 each Topical Daily  . enoxaparin (LOVENOX) injection  40 mg Subcutaneous Q12H  . tamsulosin  0.4 mg Oral Daily   Continuous Infusions: . cefTRIAXone (ROCEPHIN)  IV 1 g (12/15/19 1123)   PRN Meds:.acetaminophen **OR** acetaminophen, albuterol, LORazepam, ondansetron **OR** ondansetron (ZOFRAN) IV  Antibiotics  :    Anti-infectives (From admission, onward)   Start     Dose/Rate Route Frequency Ordered Stop   12/14/19 1130  cefTRIAXone (ROCEPHIN) 1 g in sodium chloride 0.9 % 100 mL IVPB     1 g 200 mL/hr over 30 Minutes Intravenous Every 24 hours 12/14/19 1119 12/17/19 1129        Estelle Skibicki M.D on 12/15/2019 at 3:58 PM  To page go to [email or web address] - password  [facility]  [facility] -  Office  [phone]     See all Orders from today for further details    Objective:   Vitals:   12/14/19 2000 12/14/19 2346 12/15/19 0800 12/15/19 1214  BP: 133/70  140/74 138/81  Pulse: 100   89  Resp: 12 (!) 21 20   Temp: 98.6 F (37 C)   98.9 F (37.2 C)  TempSrc: Oral   Oral  SpO2: 98%   97%  Weight:      Height:        Wt Readings from Last 3 Encounters:  12/12/19 54.4 kg  08/26/19 51.7 kg  04/20/19 54.4 kg     Intake/Output Summary (Last 24 hours) at 12/15/2019 1558 Last data filed at 12/15/2019 1200 Gross per 24 hour  Intake 900 ml  Output 1050 ml  Net -150 ml     Physical Exam  Awake Alert, easily distracted, she is think she is in psych facility, she thinks is 2020, but appears to be more coherent and conversant and appropriate today , felt confused, easily distracted, not at baseline . Symmetrical Chest wall movement, Good air movement bilaterally, CTAB RRR,No Gallops,Rubs or new Murmurs, No Parasternal Heave +ve B.Sounds, Abd Soft, No tenderness, No rebound - guarding or rigidity. No Cyanosis, Clubbing or edema, No new Rash or bruise       Data Review:    CBC Recent Labs  Lab 12/12/19 2250 12/14/19 0615 12/15/19 0541  WBC 5.1 8.6 7.3  HGB 12.1 12.4 10.6*  HCT 34.0* 36.4 30.6*  PLT 211 202 191  MCV 91.4 90.1 90.0  MCH 32.5 30.7 31.2  MCHC 35.6 34.1 34.6  RDW 12.7 12.7 12.8  LYMPHSABS 1.8 1.2 1.2  MONOABS 0.6 0.9 0.7  EOSABS 0.3 0.0 0.0  BASOSABS 0.0 0.0 0.0    Chemistries  Recent Labs  Lab 12/12/19 2250 12/14/19 0615 12/15/19 0541  NA 137 136 137  K 3.7 3.7 3.1*  CL 101 103 104  CO2 24 22 21*  GLUCOSE 100* 109* 85  BUN 22 10 9   CREATININE 1.10* 0.97 0.78  CALCIUM 9.0 8.2* 7.9*  MG  --  1.8 1.9  AST 30 31 32  ALT 18 19 21   ALKPHOS 131* 111 91  BILITOT 0.9 0.8 1.3*   ------------------------------------------------------------------------------------------------------------------ No  results for input(s): CHOL, HDL, LDLCALC, TRIG, CHOLHDL, LDLDIRECT in the last 72 hours.  No results found for: HGBA1C ------------------------------------------------------------------------------------------------------------------ No results for input(s): TSH, T4TOTAL, T3FREE, THYROIDAB in the last 72 hours.  Invalid input(s): FREET3  Cardiac Enzymes No results for input(s): CKMB, TROPONINI, MYOGLOBIN in the last 168 hours.  Invalid input(s): CK ------------------------------------------------------------------------------------------------------------------    Component Value Date/Time   BNP 33.7 12/15/2019 0541    Micro Results Recent Results (from the past 240 hour(s))  SARS CORONAVIRUS 2 (TAT 6-24 HRS) Nasopharyngeal Nasopharyngeal Swab     Status: Abnormal   Collection Time: 12/13/19  2:32 AM   Specimen: Nasopharyngeal Swab  Result Value Ref Range Status   SARS Coronavirus 2 POSITIVE (A) NEGATIVE Final    Comment: RESULT CALLED TO, READ BACK BY AND VERIFIED WITH: J. BLUE, [facility] ED CHG RN AT [phone] ON 12/13/19 BY C. JESSUP, MT. (NOTE) SARS-CoV-2 target nucleic acids are DETECTED. The SARS-CoV-2 RNA is generally detectable in upper and lower respiratory specimens during the acute phase of infection. Positive results are indicative of the presence of SARS-CoV-2 RNA. Clinical correlation with patient history and other diagnostic information is  necessary to determine patient infection status. Positive results do not rule out bacterial infection or co-infection with other viruses.  The expected result is Negative. Fact Sheet for Patients: SugarRoll.be Fact Sheet for Healthcare Providers: https://www.woods-mathews.com/ This test is not yet approved or cleared by the [place] FDA and  has been authorized for detection and/or diagnosis of SARS-CoV-2 by FDA under an Emergency Use Authorization (EUA). This EUA will remain  in effect  (meaning this te st can be used) for the duration of the COVID-19 declaration under Section 564(b)(1) of the Act, 21 U.S.C. section 360bbb-3(b)(1), unless the authorization is terminated or revoked sooner. Performed at [facility], [place] [address], [place], [place] [address]     Radiology Reports EEG  Result Date: 12/13/2019 Lora Havens, MD     12/13/2019  2:50 PM Patient Name: Jill Larson MRN: [id number] Epilepsy Attending: Lora Havens Referring Physician/Provider: Dr. Kerney Elbe Date: 12/13/2019 Duration: 26.49 minutes Patient history: 79 year old female presented with 2 weeks of worsening altered mental status.  EEG evaluate for seizures. Level of alertness: Awake AEDs during EEG study: Lorazepam Technical aspects: This EEG study was done with scalp electrodes positioned according to the 10-20 International system of electrode placement. Electrical activity was acquired at a sampling rate of 500Hz  and reviewed with a high frequency filter of 70Hz  and a low frequency filter of 1Hz . EEG data were recorded continuously and digitally stored. Description: The posterior dominant rhythm consists of 6-7 Hz activity of moderate voltage (25-35 uV) seen predominantly in posterior head regions, symmetric and  reactive to eye opening and eye closing.  EEG also showed continuous generalized 3 to 6 Hz theta-delta slowing.   Hyperventilation and photic stimulation were not performed. Abnormality -Background slow -Continued slow, generalized IMPRESSION: This study is suggestive of mild to moderate diffuse encephalopathy, nonspecific etiology. No seizures or epileptiform discharges were seen throughout the recording. Lora Havens   CT Head Wo Contrast  Result Date: 12/12/2019 CLINICAL DATA:  Altered mental status.  Decreased appetite. EXAM: CT HEAD WITHOUT CONTRAST TECHNIQUE: Contiguous axial images were obtained from the base of the skull through the vertex without intravenous contrast.  COMPARISON:  12/03/2019 FINDINGS: Brain: No apparent change. Chronic small-vessel ischemic changes of the cerebral hemispheric white matter. No sign of acute infarction, mass lesion, hemorrhage, hydrocephalus or extra-axial collection. Vascular: There is atherosclerotic calcification of the major vessels at the base of the brain. Skull: Negative Sinuses/Orbits: Clear/normal Other: None IMPRESSION: No acute finding. Chronic small-vessel ischemic changes of the white matter, similar to the previous examination Electronically Signed   By: Nelson Chimes M.D.   On: 12/12/2019 23:10   MR BRAIN WO CONTRAST  Result Date: 12/15/2019 CLINICAL DATA:  Altered mental status EXAM: MRI HEAD WITHOUT CONTRAST TECHNIQUE: Multiplanar, multiecho pulse sequences of the brain and surrounding structures were obtained without intravenous contrast. COMPARISON:  None. FINDINGS: Brain: There is no acute infarction or intracranial hemorrhage. There is no intracranial mass, mass effect, or edema. There is no hydrocephalus or extra-axial fluid collection. Patchy and confluent T2 hyperintensity in the supratentorial and pontine white matter is nonspecific but probably reflects moderate to marked chronic microvascular ischemic changes. Ventricles and sulci are within normal limits in size and configuration. Vascular: Major vessel flow voids at the skull base are preserved. Skull and upper cervical spine: Normal marrow signal is preserved. Sinuses/Orbits: Trace mucosal thickening.  Orbits are unremarkable. Other: Sella is unremarkable.  Mastoid air cells are clear. IMPRESSION: No acute infarction, hemorrhage, or mass. Moderate to marked chronic microvascular ischemic changes. Electronically Signed   By: Macy Mis M.D.   On: 12/15/2019 15:42   DG CHEST PORT 1 VIEW  Result Date: 12/13/2019 CLINICAL DATA:  Altered level of consciousness EXAM: PORTABLE CHEST 1 VIEW COMPARISON:  11/01/2010 FINDINGS: Single frontal view of the chest  demonstrates an unremarkable cardiac silhouette. No airspace disease, effusion, or pneumothorax. Minimal scarring at the lung bases. No acute bony abnormalities. IMPRESSION: 1. No acute intrathoracic process. Electronically Signed   By: Randa Ngo M.D.   On: 12/13/2019 02:22   DG Abd Portable 1V  Result Date: 12/14/2019 CLINICAL DATA:  Diarrhea.  COVID positive EXAM: PORTABLE ABDOMEN - 1 VIEW COMPARISON:  None. FINDINGS: Normal bowel gas pattern. Moderate stool volume without rectal over distension. No concerning mass effect or calcification. Extensive lumbar spine fusion extending to the pelvis with apparent pelvic screw loosening on the left. IMPRESSION: Normal bowel gas pattern. Electronically Signed   By: Monte Fantasia M.D.   On: 12/14/2019 09:01

## 2019-12-16 DIAGNOSIS — U071 COVID-19: Secondary | ICD-10-CM

## 2019-12-16 DIAGNOSIS — G2579 Other drug induced movement disorders: Secondary | ICD-10-CM

## 2019-12-16 LAB — CBC WITH DIFFERENTIAL/PLATELET
Abs Immature Granulocytes: 0.01 10*3/uL (ref 0.00–0.07)
Basophils Absolute: 0 10*3/uL (ref 0.0–0.1)
Basophils Relative: 1 %
Eosinophils Absolute: 0.2 10*3/uL (ref 0.0–0.5)
Eosinophils Relative: 3 %
HCT: 32 % — ABNORMAL LOW (ref 36.0–46.0)
Hemoglobin: 10.8 g/dL — ABNORMAL LOW (ref 12.0–15.0)
Immature Granulocytes: 0 %
Lymphocytes Relative: 27 %
Lymphs Abs: 1.7 10*3/uL (ref 0.7–4.0)
MCH: 30.5 pg (ref 26.0–34.0)
MCHC: 33.8 g/dL (ref 30.0–36.0)
MCV: 90.4 fL (ref 80.0–100.0)
Monocytes Absolute: 0.6 10*3/uL (ref 0.1–1.0)
Monocytes Relative: 9 %
Neutro Abs: 3.8 10*3/uL (ref 1.7–7.7)
Neutrophils Relative %: 60 %
Platelets: 214 10*3/uL (ref 150–400)
RBC: 3.54 MIL/uL — ABNORMAL LOW (ref 3.87–5.11)
RDW: 13.1 % (ref 11.5–15.5)
WBC: 6.4 10*3/uL (ref 4.0–10.5)
nRBC: 0 % (ref 0.0–0.2)

## 2019-12-16 LAB — COMPREHENSIVE METABOLIC PANEL
ALT: 21 U/L (ref 0–44)
AST: 28 U/L (ref 15–41)
Albumin: 2.9 g/dL — ABNORMAL LOW (ref 3.5–5.0)
Alkaline Phosphatase: 112 U/L (ref 38–126)
Anion gap: 9 (ref 5–15)
BUN: 13 mg/dL (ref 8–23)
CO2: 27 mmol/L (ref 22–32)
Calcium: 8.3 mg/dL — ABNORMAL LOW (ref 8.9–10.3)
Chloride: 105 mmol/L (ref 98–111)
Creatinine, Ser: 1.07 mg/dL — ABNORMAL HIGH (ref 0.44–1.00)
GFR calc Af Amer: 58 mL/min — ABNORMAL LOW (ref >60)
GFR calc non Af Amer: 50 mL/min — ABNORMAL LOW (ref >60)
Glucose, Bld: 101 mg/dL — ABNORMAL HIGH (ref 70–99)
Potassium: 3.3 mmol/L — ABNORMAL LOW (ref 3.5–5.1)
Sodium: 141 mmol/L (ref 135–145)
Total Bilirubin: 0.5 mg/dL (ref 0.3–1.2)
Total Protein: 6 g/dL — ABNORMAL LOW (ref 6.5–8.1)

## 2019-12-16 LAB — C-REACTIVE PROTEIN: CRP: 12.5 mg/dL — ABNORMAL HIGH (ref <1.0)

## 2019-12-16 LAB — MAGNESIUM: Magnesium: 2.1 mg/dL (ref 1.7–2.4)

## 2019-12-16 LAB — D-DIMER, QUANTITATIVE: D-Dimer, Quant: 3.07 ug/mL-FEU — ABNORMAL HIGH (ref 0.00–0.50)

## 2019-12-16 LAB — BRAIN NATRIURETIC PEPTIDE: B Natriuretic Peptide: 34 pg/mL (ref 0.0–100.0)

## 2019-12-16 MED ORDER — ALPRAZOLAM 0.5 MG PO TABS
0.5000 mg | ORAL_TABLET | Freq: Two times a day (BID) | ORAL | Status: DC
  Administered 2019-12-16 – 2019-12-17 (×2): 0.5 mg via ORAL
  Filled 2019-12-16 (×2): qty 1

## 2019-12-16 MED ORDER — LORAZEPAM 2 MG/ML IJ SOLN: 0.5000 mg | Freq: Three times a day (TID) | INTRAMUSCULAR | Status: DC | PRN

## 2019-12-16 MED ORDER — POTASSIUM CHLORIDE CRYS ER 20 MEQ PO TBCR
40.0000 meq | EXTENDED_RELEASE_TABLET | Freq: Once | ORAL | Status: AC
  Administered 2019-12-16: 40 meq via ORAL
  Filled 2019-12-16: qty 2

## 2019-12-16 NOTE — Progress Notes (Signed)
Neurology Progress Note   S:// Patient seen and examined on 5 W floor Does not offer any new complaints. Upon asking her why she is in the hospital, she says because she has been confused.  O:// Current vital signs: BP (!) 145/74 (BP Location: Right Leg)   Pulse 75   Temp 98.3 F (36.8 C) (Oral)   Resp 16   Ht 5\' 4"  (1.626 m)   Wt 51.7 kg   SpO2 99%   BMI 19.56 kg/m  Vital signs in last 24 hours: Temp:  [98.3 F (36.8 C)-98.9 F (37.2 C)] 98.3 F (36.8 C) (02/25 0413) Pulse Rate:  [75-89] 75 (02/25 0413) Resp:  [14-19] 16 (02/25 0413) BP: (138-153)/(62-81) 145/74 (02/25 0413) SpO2:  [97 %-99 %] 99 % (02/25 0413) Weight:  [51.7 kg] 51.7 kg (02/25 0300) General: Awake alert in no distress HEENT: Normocephalic atraumatic Lungs: Breathing normally and saturating well on room air Extremities warm well perfused Neurological exam Awake alert oriented x2. On asking her the date, she said December 2020. She knows that she is in the hospital because she is feeling confused but is unable to give me any further medical history. There is no evidence of aphasia but she is slow to respond to questions There is no dysarthria Cranial nerves: Pupils equal round react light, extraocular movements intact, no nystagmus, face symmetric. Motor exam: Antigravity in all fours with mild increased tone. Sensory exam: Intact light touch DTRs: 3+ bilateral brachioradialis, 3+ at the knees and ankles without clonus. Cerebellar exam: There is mild coarse tremor upon attempting finger-nose-finger but compared to documented prior exam appears better.  Medications  Current Facility-Administered Medications:  .  acetaminophen (TYLENOL) tablet 650 mg, 650 mg, Oral, Q6H PRN **OR** acetaminophen (TYLENOL) suppository 650 mg, 650 mg, Rectal, Q6H PRN, Etta Quill, DO, 650 mg at 12/14/19 0310 .  albuterol (VENTOLIN HFA) 108 (90 Base) MCG/ACT inhaler 2 puff, 2 puff, Inhalation, Q6H PRN, Lala Lund  K, MD .  cefTRIAXone (ROCEPHIN) 1 g in sodium chloride 0.9 % 100 mL IVPB, 1 g, Intravenous, Q24H, Thurnell Lose, MD, Last Rate: 200 mL/hr at 12/15/19 1123, 1 g at 12/15/19 1123 .  Chlorhexidine Gluconate Cloth 2 % PADS 6 each, 6 each, Topical, Daily, Thurnell Lose, MD, 6 each at 12/15/19 1019 .  cholecalciferol (VITAMIN D3) tablet 1,000 Units, 1,000 Units, Oral, Daily, Elgergawy, Silver Huguenin, MD, 1,000 Units at 12/16/19 0849 .  enoxaparin (LOVENOX) injection 40 mg, 40 mg, Subcutaneous, Q12H, Thurnell Lose, MD, 40 mg at 12/16/19 0849 .  LORazepam (ATIVAN) injection 0.5 mg, 0.5 mg, Intravenous, Q6H PRN, Thurnell Lose, MD, 0.5 mg at 12/16/19 0620 .  ondansetron (ZOFRAN) tablet 4 mg, 4 mg, Oral, Q6H PRN **OR** ondansetron (ZOFRAN) injection 4 mg, 4 mg, Intravenous, Q6H PRN, Alcario Drought, Jared M, DO .  propranolol (INDERAL) tablet 10 mg, 10 mg, Oral, BID, Elgergawy, Silver Huguenin, MD, 10 mg at 12/16/19 0849 .  tamsulosin (FLOMAX) capsule 0.4 mg, 0.4 mg, Oral, Daily, Lala Lund K, MD, 0.4 mg at 12/16/19 0849 Labs CBC    Component Value Date/Time   WBC 6.4 12/16/2019 0500   RBC 3.54 (L) 12/16/2019 0500   HGB 10.8 (L) 12/16/2019 0500   HCT 32.0 (L) 12/16/2019 0500   PLT 214 12/16/2019 0500   MCV 90.4 12/16/2019 0500   MCH 30.5 12/16/2019 0500   MCHC 33.8 12/16/2019 0500   RDW 13.1 12/16/2019 0500   LYMPHSABS 1.7 12/16/2019 0500   MONOABS  0.6 12/16/2019 0500   EOSABS 0.2 12/16/2019 0500   BASOSABS 0.0 12/16/2019 0500    CMP     Component Value Date/Time   NA 141 12/16/2019 0500   K 3.3 (L) 12/16/2019 0500   CL 105 12/16/2019 0500   CO2 27 12/16/2019 0500   GLUCOSE 101 (H) 12/16/2019 0500   GLUCOSE 88 08/11/2006 0947   BUN 13 12/16/2019 0500   CREATININE 1.07 (H) 12/16/2019 0500   CALCIUM 8.3 (L) 12/16/2019 0500   PROT 6.0 (L) 12/16/2019 0500   ALBUMIN 2.9 (L) 12/16/2019 0500   AST 28 12/16/2019 0500   ALT 21 12/16/2019 0500   ALKPHOS 112 12/16/2019 0500   BILITOT 0.5  12/16/2019 0500   GFRNONAA 50 (L) 12/16/2019 0500   GFRAA 58 (L) 12/16/2019 0500     Imaging I have reviewed images in epic and the results pertinent to this consultation are: MRI brain-no evidence of stroke or acute process  Assessment: 79 year old woman with 2 weeks of worsening mentation.  Initial examination revealed aphasia, diffuse coarse tremors and hyperreflexia.  Exam is improved today with much better speech output, reduction in tremor and no clonus on the ankles.  EEG was done which was unremarkable for any seizure activity.  MRI of the brain was also unremarkable for acute process. Concern for serotonin syndrome for which her neuroleptics and antidepressants were discontinued. Clinical improvement suggests that medications might have been to blame for her presentation. Suspect underlying cognitive deficits which might have been exacerbated by the current illness including Covid infection-remains asymptomatic from a respiratory standpoint.  Recommendations: Continue to hold SSRIs and neuroleptics for now. Appreciate psychiatry consultation and input. Resume medications when okay with psychiatry Continue benzos at a lower dose with a very slow taper if needed for the fear of inciting benzodiazepine withdrawal. Neurology will be available as needed.  Please call with questions.  -- Amie Portland, MD [facility] Pager: [phone] If 7pm to 7am, please call on call as listed on AMION.

## 2019-12-16 NOTE — Progress Notes (Signed)
PROGRESS NOTE                                                                                                                                                                                                             Patient Demographics:    Jill Larson, is a 79 y.o. female, DOB - 01/22/1941, [id number]  Outpatient Primary MD for the patient is Jill Riches, NP    LOS - 3  Admit date - 12/12/2019    Chief Complaint  Patient presents with  . Altered Mental Status       Brief Narrative  79 year old frail elderly Caucasian female with history of depression and hypertension was brought in by her daughter for evaluation of progressive mental decline with excessive fatigue, sleepiness and mild confusion.  In the ER she was found to have mild tremors, confusion, CT head was nonacute, she was seen by neurology and hospitalist team was requested to admit for possible serotonin syndrome.   Subjective:   Reports she peers to be more confused, she denies any complaints .   Assessment  & Plan :    Acute Covid 19 Viral infection  -Repeat chest x-ray remains with no acute findings, CRP and D-dimers are elevated, today she denies any dyspnea, so far we will hold on steroids and remdesivir - Encouraged the patient to sit up in chair in the daytime use I-S and flutter valve for pulmonary toiletry and then prone in bed when at night.     SpO2: 97 %  Recent Labs  Lab 12/13/19 0232 12/13/19 1553 12/14/19 0615 12/15/19 0541 12/16/19 0500  CRP  --   --  7.2* 16.0* 12.5*  DDIMER  --   --  3.27* 4.00* 3.07*  BNP  --   --  22.3 33.7 34.0  PROCALCITON  --  <0.10 <0.10 <0.10  --   SARSCOV2NAA POSITIVE*  --   --   --   --       Metabolic encephalopathy  -most likely serotonin syndrome.  Neurology input greatly appreciated, appears to be improving on physical exam today, less tremors, and no clonus. -Psychiatry consult  greatly appreciated, continue to hold Zyprexa, Effexor and Lexapro, continue with benzodiazepine only. - Neurology input greatly appreciated, MRI with no acute findings, no focal deficits, head CT and EEG nonacute.   -Continue  with benzos at a lower dose to prevent withdrawal.  Hypertension.  Resume home dose propranolol  Acute urinary retention.  Could be due to serotonin syndrome as well    Condition - Extremely Guarded  Family Communication  : Tried to reach daughter again today, unable to leave a voicemail  Code Status : Full  Diet :   Diet Order            DIET SOFT Room service appropriate? Yes; Fluid consistency: Thin  Diet effective now               Disposition Plan  : Stay in the hospital, getting treatment for metabolic encephalopathy, serotonin syndrome remains confused, mentation far from baseline  Consults  : Neurology, psychiatry  Procedures  :    CT head.  Nonacute  EEG.  Nonspecific changes no seizure focus.  RI brain.  Pending  PUD Prophylaxis : None  DVT Prophylaxis  :  Lovenox   Lab Results  Component Value Date   PLT 214 12/16/2019    Inpatient Medications  Scheduled Meds: . ALPRAZolam  0.5 mg Oral BID  . Chlorhexidine Gluconate Cloth  6 each Topical Daily  . cholecalciferol  1,000 Units Oral Daily  . enoxaparin (LOVENOX) injection  40 mg Subcutaneous Q12H  . propranolol  10 mg Oral BID  . tamsulosin  0.4 mg Oral Daily   Continuous Infusions:  PRN Meds:.acetaminophen **OR** acetaminophen, albuterol, LORazepam, ondansetron **OR** ondansetron (ZOFRAN) IV  Antibiotics  :    Anti-infectives (From admission, onward)   Start     Dose/Rate Route Frequency Ordered Stop   12/14/19 1130  cefTRIAXone (ROCEPHIN) 1 g in sodium chloride 0.9 % 100 mL IVPB     1 g 200 mL/hr over 30 Minutes Intravenous Every 24 hours 12/14/19 1119 12/16/19 1241        Jeremias Broyhill M.D on 12/16/2019 at 4:18 PM  To page go to [email or web address] - password  [place]  [facility] -  Office  [phone]     See all Orders from today for further details    Objective:   Vitals:   12/15/19 2158 12/16/19 0300 12/16/19 0413 12/16/19 1200  BP: (!) 153/65  (!) 145/74 122/66  Pulse: 88  75 81  Resp:  19 16   Temp:   98.3 F (36.8 C) 98 F (36.7 C)  TempSrc:   Oral Oral  SpO2:   99% 97%  Weight:  51.7 kg    Height:        Wt Readings from Last 3 Encounters:  12/16/19 51.7 kg  08/26/19 51.7 kg  04/20/19 54.4 kg     Intake/Output Summary (Last 24 hours) at 12/16/2019 1618 Last data filed at 12/16/2019 0850 Gross per 24 hour  Intake 490 ml  Output --  Net 490 ml     Physical Exam  Patient is somnolent today, she remains confused Symmetrical Chest wall movement, Good air movement bilaterally, CTAB RRR,No Gallops,Rubs or new Murmurs, No Parasternal Heave +ve B.Sounds, Abd Soft, No tenderness, No rebound - guarding or rigidity. No Cyanosis, Clubbing or edema, No new Rash or bruise       Data Review:    CBC Recent Labs  Lab 12/12/19 2250 12/14/19 0615 12/15/19 0541 12/16/19 0500  WBC 5.1 8.6 7.3 6.4  HGB 12.1 12.4 10.6* 10.8*  HCT 34.0* 36.4 30.6* 32.0*  PLT 211 202 191 214  MCV 91.4 90.1 90.0 90.4  MCH 32.5 30.7 31.2 30.5  MCHC  35.6 34.1 34.6 33.8  RDW 12.7 12.7 12.8 13.1  LYMPHSABS 1.8 1.2 1.2 1.7  MONOABS 0.6 0.9 0.7 0.6  EOSABS 0.3 0.0 0.0 0.2  BASOSABS 0.0 0.0 0.0 0.0    Chemistries  Recent Labs  Lab 12/12/19 2250 12/14/19 0615 12/15/19 0541 12/16/19 0500  NA 137 136 137 141  K 3.7 3.7 3.1* 3.3*  CL 101 103 104 105  CO2 24 22 21* 27  GLUCOSE 100* 109* 85 101*  BUN 22 10 9 13   CREATININE 1.10* 0.97 0.78 1.07*  CALCIUM 9.0 8.2* 7.9* 8.3*  MG  --  1.8 1.9 2.1  AST 30 31 32 28  ALT 18 19 21 21   ALKPHOS 131* 111 91 112  BILITOT 0.9 0.8 1.3* 0.5   ------------------------------------------------------------------------------------------------------------------ No results for input(s): CHOL,  HDL, LDLCALC, TRIG, CHOLHDL, LDLDIRECT in the last 72 hours.  No results found for: HGBA1C ------------------------------------------------------------------------------------------------------------------ No results for input(s): TSH, T4TOTAL, T3FREE, THYROIDAB in the last 72 hours.  Invalid input(s): FREET3  Cardiac Enzymes No results for input(s): CKMB, TROPONINI, MYOGLOBIN in the last 168 hours.  Invalid input(s): CK ------------------------------------------------------------------------------------------------------------------    Component Value Date/Time   BNP 34.0 12/16/2019 0500    Micro Results Recent Results (from the past 240 hour(s))  SARS CORONAVIRUS 2 (TAT 6-24 HRS) Nasopharyngeal Nasopharyngeal Swab     Status: Abnormal   Collection Time: 12/13/19  2:32 AM   Specimen: Nasopharyngeal Swab  Result Value Ref Range Status   SARS Coronavirus 2 POSITIVE (A) NEGATIVE Final    Comment: RESULT CALLED TO, READ BACK BY AND VERIFIED WITH: J. BLUE, [facility] ED CHG RN AT [phone] ON 12/13/19 BY C. JESSUP, MT. (NOTE) SARS-CoV-2 target nucleic acids are DETECTED. The SARS-CoV-2 RNA is generally detectable in upper and lower respiratory specimens during the acute phase of infection. Positive results are indicative of the presence of SARS-CoV-2 RNA. Clinical correlation with patient history and other diagnostic information is  necessary to determine patient infection status. Positive results do not rule out bacterial infection or co-infection with other viruses.  The expected result is Negative. Fact Sheet for Patients: SugarRoll.be Fact Sheet for Healthcare Providers: https://www.woods-mathews.com/ This test is not yet approved or cleared by the [place] FDA and  has been authorized for detection and/or diagnosis of SARS-CoV-2 by FDA under an Emergency Use Authorization (EUA). This EUA will remain  in effect (meaning this te st can be used)  for the duration of the COVID-19 declaration under Section 564(b)(1) of the Act, 21 U.S.C. section 360bbb-3(b)(1), unless the authorization is terminated or revoked sooner. Performed at [facility], [place] [address], [place], [place] [address]     Radiology Reports EEG  Result Date: 12/13/2019 Lora Havens, MD     12/13/2019  2:50 PM Patient Name: Jill Larson MRN: [id number] Epilepsy Attending: Lora Havens Referring Physician/Provider: Dr. Kerney Elbe Date: 12/13/2019 Duration: 26.49 minutes Patient history: 79 year old female presented with 2 weeks of worsening altered mental status.  EEG evaluate for seizures. Level of alertness: Awake AEDs during EEG study: Lorazepam Technical aspects: This EEG study was done with scalp electrodes positioned according to the 10-20 International system of electrode placement. Electrical activity was acquired at a sampling rate of 500Hz  and reviewed with a high frequency filter of 70Hz  and a low frequency filter of 1Hz . EEG data were recorded continuously and digitally stored. Description: The posterior dominant rhythm consists of 6-7 Hz activity of moderate voltage (25-35 uV) seen predominantly in  posterior head regions, symmetric and reactive to eye opening and eye closing.  EEG also showed continuous generalized 3 to 6 Hz theta-delta slowing.   Hyperventilation and photic stimulation were not performed. Abnormality -Background slow -Continued slow, generalized IMPRESSION: This study is suggestive of mild to moderate diffuse encephalopathy, nonspecific etiology. No seizures or epileptiform discharges were seen throughout the recording. Lora Havens   CT Head Wo Contrast  Result Date: 12/12/2019 CLINICAL DATA:  Altered mental status.  Decreased appetite. EXAM: CT HEAD WITHOUT CONTRAST TECHNIQUE: Contiguous axial images were obtained from the base of the skull through the vertex without intravenous contrast. COMPARISON:  12/03/2019 FINDINGS:  Brain: No apparent change. Chronic small-vessel ischemic changes of the cerebral hemispheric white matter. No sign of acute infarction, mass lesion, hemorrhage, hydrocephalus or extra-axial collection. Vascular: There is atherosclerotic calcification of the major vessels at the base of the brain. Skull: Negative Sinuses/Orbits: Clear/normal Other: None IMPRESSION: No acute finding. Chronic small-vessel ischemic changes of the white matter, similar to the previous examination Electronically Signed   By: Nelson Chimes M.D.   On: 12/12/2019 23:10   MR BRAIN WO CONTRAST  Result Date: 12/15/2019 CLINICAL DATA:  Altered mental status EXAM: MRI HEAD WITHOUT CONTRAST TECHNIQUE: Multiplanar, multiecho pulse sequences of the brain and surrounding structures were obtained without intravenous contrast. COMPARISON:  None. FINDINGS: Brain: There is no acute infarction or intracranial hemorrhage. There is no intracranial mass, mass effect, or edema. There is no hydrocephalus or extra-axial fluid collection. Patchy and confluent T2 hyperintensity in the supratentorial and pontine white matter is nonspecific but probably reflects moderate to marked chronic microvascular ischemic changes. Ventricles and sulci are within normal limits in size and configuration. Vascular: Major vessel flow voids at the skull base are preserved. Skull and upper cervical spine: Normal marrow signal is preserved. Sinuses/Orbits: Trace mucosal thickening.  Orbits are unremarkable. Other: Sella is unremarkable.  Mastoid air cells are clear. IMPRESSION: No acute infarction, hemorrhage, or mass. Moderate to marked chronic microvascular ischemic changes. Electronically Signed   By: Macy Mis M.D.   On: 12/15/2019 15:42   DG Chest Port 1 View  Result Date: 12/15/2019 CLINICAL DATA:  Fatigue, somnolence, confusion, hypertension EXAM: PORTABLE CHEST 1 VIEW COMPARISON:  12/13/2019 FINDINGS: Single frontal view of the chest demonstrates a stable cardiac  silhouette. Chronic interstitial scarring without airspace disease, effusion, or pneumothorax. No acute bony abnormality. IMPRESSION: 1. Stable exam, no acute process. Electronically Signed   By: Randa Ngo M.D.   On: 12/15/2019 19:16   DG CHEST PORT 1 VIEW  Result Date: 12/13/2019 CLINICAL DATA:  Altered level of consciousness EXAM: PORTABLE CHEST 1 VIEW COMPARISON:  11/01/2010 FINDINGS: Single frontal view of the chest demonstrates an unremarkable cardiac silhouette. No airspace disease, effusion, or pneumothorax. Minimal scarring at the lung bases. No acute bony abnormalities. IMPRESSION: 1. No acute intrathoracic process. Electronically Signed   By: Randa Ngo M.D.   On: 12/13/2019 02:22   DG Abd Portable 1V  Result Date: 12/14/2019 CLINICAL DATA:  Diarrhea.  COVID positive EXAM: PORTABLE ABDOMEN - 1 VIEW COMPARISON:  None. FINDINGS: Normal bowel gas pattern. Moderate stool volume without rectal over distension. No concerning mass effect or calcification. Extensive lumbar spine fusion extending to the pelvis with apparent pelvic screw loosening on the left. IMPRESSION: Normal bowel gas pattern. Electronically Signed   By: Monte Fantasia M.D.   On: 12/14/2019 09:01

## 2019-12-16 NOTE — Progress Notes (Signed)
Physical Therapy Treatment Patient Details Name: Jill Larson MRN: [id number] DOB: December 17, 1940 Today's Date: 12/16/2019    History of Present Illness 79 year old female admitted 12/12/19 presenting with progressive decline in the last weeks with excessive fatigue, sleepiness and mild confusion per daughter's report. In ER, patient noted with mild tremors. CT head nonacute. Patient with altered mental status. She recently started medication for depression. 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 EEG negative for seizures. MRI brain without evidence of stroke or acute process. 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 Patient also with diarrhea and +COVID. PMH: depression, HTN    PT Comments    Patient continues to be disoriented, tries to get OOB on her own despite being able to verbally comment on what buttom to press on call bell for assistance. Progression of patient's mobility this session with gait training with and without RW. Patient is still unsteady in standing requiring hands on assist, improved with use of RW but requires cues and hand over hand assist at times for safe use of RW. Continued recommendation for skilled PT services and discharge to SNF for short term rehabilitation as patient is below her PLOF.    Follow Up Recommendations  SNF;Supervision/Assistance - 24 hour;Supervision for mobility/OOB     Equipment Recommendations  Rolling walker with 5" wheels;3in1 (PT)       Precautions / Restrictions Precautions Precautions: Fall;Other (comment)(cognition) Precaution Comments: impuslive, impaired cognition    Mobility  Bed Mobility Overal bed mobility: Needs Assistance Bed Mobility: Supine to Sit;Sit to Supine     Supine to sit: Supervision;Min guard(pt sitting EOB or near sitting EOB when PT passes by room) Sit to supine: Supervision   General bed mobility comments: impulsive, attempts to get up despite side rail up, reports room  spinning when assisted back to supine at end of session but improved with supine rest  Transfers Overall transfer level: Needs assistance Equipment used: None Transfers: Sit to/from Stand;Stand Pivot Transfers Sit to Stand: Min assist Stand pivot transfers: Min assist       General transfer comment: stand-step transfer EOB<>BSC, sit<>stand from EOB x 2 trials one with RW one without AD, use of gait belt  Ambulation/Gait Ambulation/Gait assistance: Min assist   Assistive device: Rolling walker (2 wheeled);1 person hand held assist Gait Pattern/deviations: Decreased step length - right;Decreased step length - left;Staggering right;Staggering left;Leaning posteriorly Gait velocity: decreased   General Gait Details: Post ambulation trial 1 SpO2 98% on room air, HR 83 bpm. Gait trial 1 41ft in room with RW and minA with assistance for RW management. Gait trial 2 with HHA 55ft with minA.          Balance Overall balance assessment: Needs assistance Sitting-balance support: Feet supported Sitting balance-Leahy Scale: Good     Standing balance support: Single extremity supported;Bilateral upper extremity supported Standing balance-Leahy Scale: Poor Standing balance comment: standing balance with RW with contact guard, standing balance without AD with minA       Cognition Arousal/Alertness: Awake/alert Behavior During Therapy: Impulsive Overall Cognitive Status: Impaired/Different from baseline Area of Impairment: Orientation;Safety/judgement   Orientation Level: Disoriented to;Place;Time;Situation(thinks she is at vet's for her dog, thinks it is Dec 2020)       Safety/Judgement: Decreased awareness of safety;Decreased awareness of deficits     General Comments: Everytime this PT passes by patient's room, patient is attempting to long sit in bed or sit at EOB around bedrail that is up. PT assisted patient earlier this morning with getting  back into bed safely prior to PT  session.        PT Goals (current goals can now be found in the care plan section) Acute Rehab PT Goals PT Goal Formulation: Patient unable to participate in goal setting    Frequency    Min 3X/week      PT Plan Current plan remains appropriate       AM-PAC PT "6 Clicks" Mobility   Outcome Measure  Help needed turning from your back to your side while in a flat bed without using bedrails?: A Little Help needed moving from lying on your back to sitting on the side of a flat bed without using bedrails?: A Little Help needed moving to and from a bed to a chair (including a wheelchair)?: A Little Help needed standing up from a chair using your arms (e.g., wheelchair or bedside chair)?: A Little Help needed to walk in hospital room?: A Little Help needed climbing 3-5 steps with a railing? : A Lot 6 Click Score: 17    End of Session Equipment Utilized During Treatment: Gait belt Activity Tolerance: Patient tolerated treatment well Patient left: in bed;with call bell/phone within reach;with bed alarm set(R sidelying with R rail up) Nurse Communication: (spoke to nurse pre session) PT Visit Diagnosis: Unsteadiness on feet (R26.81);Other abnormalities of gait and mobility (R26.89);Muscle weakness (generalized) (M62.81)     Time: 1001-1029 (seen [id number] and 1002-1025) 28 minutes    Charges:   1 Visit 1 Gait 1 Therapeutic Activity                    Birdie Hopes, DPT, PT Acute Rehab [phone]     Birdie Hopes 12/16/2019, 2:09 PM

## 2019-12-16 NOTE — TOC Progression Note (Signed)
Transition of Care [facility]) - Progression Note    Patient Details  Name: MARVA KROMER MRN: [id number] Date of Birth: Jan 20, 1941  Transition of Care [facility]) CM/SW [place], [place] Work Phone Number: 12/16/2019, 3:55 PM  Clinical Narrative:    MSW spoke with [name] regarding pt's discharge plans. Santiago Glad voiced concerns about sending her to a facility as she discussed this with her family member who said that it was not a good idea. MSW acknowledged this and said that [facility] cannot take patient at this time, since this was the choice that was preferred, but that [facility] could take patient. Daughter still voiced concerns about rehab facility. MSW told daughter that [facility] may be an option if her insurance got approved (but said that it may be unlikely). Santiago Glad acknowledged this and said that she could take care of patient since she does HH herself and has exercise equipment that patient could use if possible. MSW confirmed pt's address, pcp, and equipment needs. No equipment is needed at this time. MSW and CSW will continue to follow up if needed.     Expected Discharge Plan: [place] Barriers to Discharge: Continued Medical Work up  Expected Discharge Plan and Services Expected Discharge Plan: [place] In-house Referral: Clinical Social Work Discharge Planning Services: CM Consult Post Acute Care Choice: [place] arrangements for the past 2 months: Single Family Home                                       Social Determinants of Health (SDOH) Interventions    Readmission Risk Interventions No flowsheet data found.

## 2019-12-17 LAB — COMPREHENSIVE METABOLIC PANEL
ALT: 20 U/L (ref 0–44)
AST: 23 U/L (ref 15–41)
Albumin: 2.9 g/dL — ABNORMAL LOW (ref 3.5–5.0)
Alkaline Phosphatase: 100 U/L (ref 38–126)
Anion gap: 9 (ref 5–15)
BUN: 13 mg/dL (ref 8–23)
CO2: 24 mmol/L (ref 22–32)
Calcium: 8.1 mg/dL — ABNORMAL LOW (ref 8.9–10.3)
Chloride: 107 mmol/L (ref 98–111)
Creatinine, Ser: 0.74 mg/dL (ref 0.44–1.00)
GFR calc Af Amer: >60 mL/min (ref >60)
GFR calc non Af Amer: >60 mL/min (ref >60)
Glucose, Bld: 102 mg/dL — ABNORMAL HIGH (ref 70–99)
Potassium: 3.5 mmol/L (ref 3.5–5.1)
Sodium: 140 mmol/L (ref 135–145)
Total Bilirubin: 0.2 mg/dL — ABNORMAL LOW (ref 0.3–1.2)
Total Protein: 5.8 g/dL — ABNORMAL LOW (ref 6.5–8.1)

## 2019-12-17 LAB — CBC WITH DIFFERENTIAL/PLATELET
Abs Immature Granulocytes: 0.02 10*3/uL (ref 0.00–0.07)
Basophils Absolute: 0 10*3/uL (ref 0.0–0.1)
Basophils Relative: 1 %
Eosinophils Absolute: 0.4 10*3/uL (ref 0.0–0.5)
Eosinophils Relative: 7 %
HCT: 31.6 % — ABNORMAL LOW (ref 36.0–46.0)
Hemoglobin: 11.1 g/dL — ABNORMAL LOW (ref 12.0–15.0)
Immature Granulocytes: 0 %
Lymphocytes Relative: 37 %
Lymphs Abs: 2.2 10*3/uL (ref 0.7–4.0)
MCH: 31.7 pg (ref 26.0–34.0)
MCHC: 35.1 g/dL (ref 30.0–36.0)
MCV: 90.3 fL (ref 80.0–100.0)
Monocytes Absolute: 0.4 10*3/uL (ref 0.1–1.0)
Monocytes Relative: 7 %
Neutro Abs: 2.9 10*3/uL (ref 1.7–7.7)
Neutrophils Relative %: 48 %
Platelets: 229 10*3/uL (ref 150–400)
RBC: 3.5 MIL/uL — ABNORMAL LOW (ref 3.87–5.11)
RDW: 13.2 % (ref 11.5–15.5)
WBC: 6 10*3/uL (ref 4.0–10.5)
nRBC: 0 % (ref 0.0–0.2)

## 2019-12-17 LAB — MAGNESIUM: Magnesium: 2.1 mg/dL (ref 1.7–2.4)

## 2019-12-17 LAB — C-REACTIVE PROTEIN: CRP: 6.6 mg/dL — ABNORMAL HIGH (ref <1.0)

## 2019-12-17 LAB — D-DIMER, QUANTITATIVE: D-Dimer, Quant: 1.97 ug/mL-FEU — ABNORMAL HIGH (ref 0.00–0.50)

## 2019-12-17 LAB — BRAIN NATRIURETIC PEPTIDE: B Natriuretic Peptide: 34.4 pg/mL (ref 0.0–100.0)

## 2019-12-17 NOTE — Progress Notes (Signed)
Jill Larson to be D/C'd Home per MD order.  Discussed with the patient and all questions fully answered.  VSS, Skin clean, dry and intact without evidence of skin break down, no evidence of skin tears noted. IV catheter discontinued intact. Site without signs and symptoms of complications. Dressing and pressure applied.  An After Visit Summary was printed and given to the patient.   D/c education completed with patient/family including follow up instructions, medication list, d/c activities limitations if indicated, with other d/c instructions as indicated by MD.  Patient instructed to return to ED, call 911, or call MD for any changes in condition.   Patient escorted via stretcher, and D/C home via PTAR.  [name] 12/17/2019 3:08 PM

## 2019-12-17 NOTE — TOC Transition Note (Addendum)
Transition of Care [facility]) - CM/SW Discharge Note   Patient Details  Name: Jill Larson MRN: [id number] Date of Birth: 03-04-1941  Transition of Care [facility]) CM/SW Contact:  Benard Halsted, LCSW Phone Number: 12/17/2019, 1:40 PM   Clinical Narrative:    CSW confirmed dc plan with patient's daughter. She is still in agreement with home health services rather than SNF. She declines need for equipment as patient already has a walker and bedside commode. Daughter plans to install grab bars in the shower. Choice given; per [facility], Jill Larson has accepted patient for services. Daughter notified that start date will likely be after 48 hours post discharge. MD placing home health orders. Daughter requesting PTAR home and would like a call once they arrive to pick patient up. Outpatient psych resource listed on AVS per daughter request. No other needs identified at this time.   PTAR scheduled.    Final next level of care: [place] Barriers to Discharge: No Barriers Identified   Patient Goals and CMS Choice Patient states their goals for this hospitalization and ongoing recovery are:: To get better enough to go home. CMS Medicare.gov Compare Post Acute Care list provided to:: Other (Comment Required)(Jill Larson, Daughter) Choice offered to / list presented to : Adult Children  Discharge Placement                Patient to be transferred to facility by: [place] Name of family member notified: Daughter, Jill Larson Patient and family notified of of transfer: 12/17/19  Discharge Plan and Services In-house Referral: Clinical Social Work Discharge Planning Services: [employer] Consult Post Acute Care Choice: [place]                    HH Arranged: PT, OT, Nurse's Aide Guadalupe Guerra Agency: [place] Date [place]: 12/17/19 Time [place]: 1339 Representative spoke with at [place]: [place] ([place]) Interventions      Readmission Risk Interventions No flowsheet data found.

## 2019-12-17 NOTE — Discharge Instructions (Signed)
Follow with Primary MD Imagene Riches, NP in 7 days   Get CBC, CMP,  checked  by Primary MD next visit.    Activity: As tolerated with Full fall precautions use walker/cane & assistance as needed   Disposition Home    Diet: Regular Diet  For Heart failure patients - Check your Weight same time everyday, if you gain over 2 pounds, or you develop in leg swelling, experience more shortness of breath or chest pain, call your Primary MD immediately. Follow Cardiac Low Salt Diet and 1.5 lit/day fluid restriction.   On your next visit with your primary care physician please Get Medicines reviewed and adjusted.   Please request your Prim.MD to go over all Hospital Tests and Procedure/Radiological results at the follow up, please get all Hospital records sent to your Prim MD by signing hospital release before you go home.   If you experience worsening of your admission symptoms, develop shortness of breath, life threatening emergency, suicidal or homicidal thoughts you must seek medical attention immediately by calling 911 or calling your MD immediately  if symptoms less severe.  You Must read complete instructions/literature along with all the possible adverse reactions/side effects for all the Medicines you take and that have been prescribed to you. Take any new Medicines after you have completely understood and accpet all the possible adverse reactions/side effects.   Do not drive, operating heavy machinery, perform activities at heights, swimming or participation in water activities or provide baby sitting services if your were admitted for syncope or siezures until you have seen by Primary MD or a Neurologist and advised to do so again.  Do not drive when taking Pain medications.    Do not take more than prescribed Pain, Sleep and Anxiety Medications  Special Instructions: If you have smoked or chewed Tobacco  in the last 2 yrs please stop smoking, stop any regular Alcohol  and or any  Recreational drug use.  Wear Seat belts while driving.   Please note  You were cared for by a hospitalist during your hospital stay. If you have any questions about your discharge medications or the care you received while you were in the hospital after you are discharged, you can call the unit and asked to speak with the hospitalist on call if the hospitalist that took care of you is not available. Once you are discharged, your primary care physician will handle any further medical issues. Please note that NO REFILLS for any discharge medications will be authorized once you are discharged, as it is imperative that you return to your primary care physician (or establish a relationship with a primary care physician if you do not have one) for your aftercare needs so that they can reassess your need for medications and monitor your lab values.

## 2019-12-17 NOTE — Progress Notes (Signed)
Occupational Therapy Treatment Patient Details Name: ZARAE NEER MRN: [id number] DOB: 13-Nov-1940 Today's Date: 12/17/2019    History of present illness 79 year old female admitted 12/12/19 presenting with progressive decline in the last weeks with excessive fatigue, sleepiness and mild confusion per daughter's report. In ER, patient noted with mild tremors. CT head nonacute. Patient with altered mental status. She recently started medication for depression. 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 EEG negative for seizures. MRI brain without evidence of stroke or acute process. 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 Patient also with diarrhea and +COVID. PMH: depression, HTN   OT comments  Pt progressing towards OT goals with improved functional abilities,but still limited by confusion. Pt Setup for grooming tasks while seated in recliner chair. Pt declined need for toileting task. Guided pt in sit to stand trials x 10 from recliner without AD at Supervision level, assessing balance and safety technique implementation. Instructed pt in use of flutter valve and incentive spirometer, with increasing difficulties following directions using the latter device requiring consistent cues to pull in, rather than push out breath. Based on cognition and safety concerns, recommend SNF for short-term rehab. Will continue to follow acutely.    Follow Up Recommendations  SNF    Equipment Recommendations  Other (comment)    Recommendations for Other Services      Precautions / Restrictions Precautions Precautions: Fall;Other (comment) Restrictions Weight Bearing Restrictions: No       Mobility Bed Mobility               General bed mobility comments: Received in chair  Transfers Overall transfer level: Needs assistance Equipment used: None Transfers: Sit to/from Stand Sit to Stand: Supervision         General transfer comment: Guided pt in sit to  stand transfers x 10 to assess balance. Dropped to 86% on RA, but quickly recovered to 96% O2     Balance                                           ADL either performed or assessed with clinical judgement   ADL Overall ADL's : Needs assistance/impaired     Grooming: Set up;Brushing hair;Wash/dry face;Sitting                                       Vision       Perception     Praxis      Cognition Arousal/Alertness: Awake/alert Behavior During Therapy: Impulsive Overall Cognitive Status: Impaired/Different from baseline Area of Impairment: Orientation;Safety/judgement                 Orientation Level: Time;Situation       Safety/Judgement: Decreased awareness of safety;Decreased awareness of deficits     General Comments: Pt with difficulty following directions, requiring constant cues        Exercises Exercises: Other exercises Other Exercises Other Exercises: Flutter valve x 10 Other Exercises: IS up to 1010mL   Shoulder Instructions       General Comments      Pertinent Vitals/ Pain       Pain Assessment: No/denies pain  Home Living  Prior Functioning/Environment              Frequency  Min 2X/week        Progress Toward Goals  OT Goals(current goals can now be found in the care plan section)  Progress towards OT goals: Progressing toward goals  Acute Rehab OT Goals Patient Stated Goal: none stated OT Goal Formulation: With patient Time For Goal Achievement: 12/27/19 Potential to Achieve Goals: Fair ADL Goals Pt Will Perform Eating: with set-up Pt Will Perform Grooming: with set-up Pt Will Perform Upper Body Bathing: with min assist Pt Will Perform Lower Body Bathing: with min assist Pt Will Perform Upper Body Dressing: with min assist Pt Will Perform Lower Body Dressing: with min assist Pt Will Transfer to Toilet: with min  assist Additional ADL Goal #1: Pt will follow simple one step contextual commands at least 75% of the time  Plan Discharge plan remains appropriate    Co-evaluation                 AM-PAC OT "6 Clicks" Daily Activity     Outcome Measure   Help from another person eating meals?: A Little Help from another person taking care of personal grooming?: A Little Help from another person toileting, which includes using toliet, bedpan, or urinal?: A Little Help from another person bathing (including washing, rinsing, drying)?: A Little Help from another person to put on and taking off regular upper body clothing?: A Little Help from another person to put on and taking off regular lower body clothing?: A Little 6 Click Score: 18    End of Session Equipment Utilized During Treatment: Other (comment)(none)  OT Visit Diagnosis: Unsteadiness on feet (R26.81);Muscle weakness (generalized) (M62.81);Other symptoms and signs involving cognitive function   Activity Tolerance Patient tolerated treatment well   Patient Left in chair;with call bell/phone within reach;with chair alarm set   Nurse Communication          Time: [phone] OT Time Calculation (min): 18 min  Charges: OT General Charges $OT Visit: 1 Visit OT Treatments $Therapeutic Activity: 8-22 mins  Layla Maw, OTR/L   Layla Maw 12/17/2019, 2:11 PM

## 2019-12-17 NOTE — Care Management (Addendum)
After pt left unit via [facility]AR for discharge home, CM informed by [facility] that [facility] will be pushed out to next weekend due to staffing constraints.  CM was unable to reach pts daughter to request second preference of [facility] agency.   CM provided referral to [facility] - agency reviewing for acceptance or HHPT, RN and SW.  If agency can accept - [facility] will be Monday.    Update [id number]:  [facility] can accept for HHPT, RN, SW and start of care is Monday 12/20/19.  Attending informed of change with [facility] due to [facility] delay - Attending in agreement for [facility], [place] and SW to begin Monday 12/20/19.   CSW contacted family  to inform them of the change of agency.  HH order updated to reflect changes.

## 2019-12-17 NOTE — Discharge Summary (Signed)
Jill Larson, is a 79 y.o. female  DOB 08/18/1941  MRN [id number].  Admission date:  12/12/2019  Admitting Physician  Thurnell Lose, MD  Discharge Date:  12/17/2019   Primary MD  Imagene Riches, NP  Recommendations for primary care physician for things to follow:  - Please check CBC, CMP during next visit. -Patient will need to follow with psychiatry in 2 weeks regarding adjustment of her medications.   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 Anxiety   . Benign fundic gland polyps of stomach 2001  . DDD (degenerative disc disease)   . Depression   . Depression   . Fibromyalgia   . Hyperlipidemia   . Hypertension   . Mitral valve prolapse   . Osteoporosis   . Scoliosis     Past Surgical History:  Procedure Laterality Date  . BACK SURGERY    . BREAST EXCISIONAL BIOPSY Right pt unsure  . BREAST SURGERY     right to remove calcium deposits  . TONSILLECTOMY AND ADENOIDECTOMY    . TUBAL LIGATION         History of present illness and  Hospital Course:     Kindly see H&P for history of present illness and admission details, please review complete Labs, Consult reports and Test reports for all details in brief  HPI  from the history and physical done on the day of admission 12/13/2019  HPI: Jill Larson is a 79 y.o. female with medical history significant of depression, HTN.  Presents to ED with  several weeks to months history of progressive decline per daughter.  Increased fatigue and sleeping.  Daughter suspects depression meds may be contributing.  Patient currently taking: Propranolol 10mg  BID Lexapro 20mg  once daily Venlafaxine 37.5 once at night Benztropine 1mg  BID zyprexa 5mg  once a day Xanax 1mg  BID PRN (last took last PM)  Last change was ~3-4 weeks ago.  Last thurs: PCP said "iron was low" and vit d got increased.  ED Course: In the ED, patient is most certainly not fatigued or sleeping.  She is awake, alert (hypervigilant if anything), cannot participate meaningfully in history.  Can follow some commands, but when asked questions will start muttering and answering nonsensically.    Hospital Course   79 year old frail elderly Caucasian female with history of depression and hypertension was brought in by her daughter for evaluation of progressive mental decline with excessive fatigue, sleepiness and mild confusion.  In the ER she was found to have mild tremors, confusion, CT head was nonacute, she was seen by neurology and hospitalist team was  requested to admit for possible serotonin syndrome.  Acute Covid 19 Viral infection  -Repeat chest x-ray remains with no acute findings, she is not hypoxic, no dyspnea, CRP and D-dimers are elevated on admission, but they are trending down which is reassuring, I have discussed with the daughter to monitor closely for any respiratory symptoms, as she might be early in her Covid illness to develop any lung disease , so far no indication for remdesivir or steroids .  COVID-19 Labs  Recent Labs    12/15/19 0541 12/16/19 0500 12/17/19 0412  DDIMER 4.00* 3.07* 1.97*  CRP 16.0* 12.5* 6.6*    Lab Results  Component Value Date   SARSCOV2NAA POSITIVE (A) 12/13/2019      Acute Metabolic encephalopathy  -most likely serotonin syndrome.  Neurology input greatly appreciated, appears to be improving on physical exam today,  less tremors, and no clonus. -Psychiatry consult greatly appreciated, will discontinue Zyprexa, Effexor and Lexapro, continue with benzodiazepine only, to follow as an outpatient with psychiatry and 2 weeks . - Neurology input greatly appreciated, MRI with no acute findings, no focal deficits, head CT and EEG nonacute.   -Continue with benzos at a lower dose to prevent withdrawal.  Hypertension.  Resume home dose propranolol  Acute urinary retention.  Could be due to serotonin syndrome as well, resolved   Discharge Condition:  Stable -Commendation has been made for SNF, but patient and daughter wants patient to go home, and they declined SNF    Discharge Instructions  and  Discharge Medications     Discharge Instructions    Diet - low sodium heart healthy   Complete by: As directed    Increase activity slowly   Complete by: As directed      Allergies as of 12/17/2019   No Known Allergies     Medication List    STOP taking these medications   benztropine 1 MG tablet Commonly known as: COGENTIN   escitalopram 20 MG tablet Commonly known as: LEXAPRO   OLANZapine 5 MG tablet Commonly known as: ZYPREXA   venlafaxine XR 37.5 MG 24 hr capsule Commonly known as: EFFEXOR-XR     TAKE these medications   ALPRAZolam 1 MG tablet Commonly known as: XANAX Take 1 mg by mouth 2 (two) times daily.   b complex vitamins capsule Take 1 capsule by mouth daily.   Fish Oil 1000 MG Caps Take 1 capsule by mouth daily.   propranolol 10 MG tablet Commonly known as: INDERAL Take 10 mg by mouth 2 (two) times daily.   VITAMIN D PO Take 1 capsule by mouth in the morning and at bedtime.         Diet and Activity recommendation: See Discharge Instructions above   Consults obtained -  Neurology Psychiatry   Major procedures and Radiology Reports - PLEASE review detailed and final reports for all details, in brief -      EEG  Result Date: 12/13/2019 Lora Havens, MD     12/13/2019  2:50 PM Patient Name: Jill Larson MRN: [id number] Epilepsy Attending: Lora Havens Referring Physician/Provider: Dr. Kerney Elbe Date: 12/13/2019 Duration: 26.49 minutes Patient history: 79 year old female presented with 2 weeks of worsening altered mental status.  EEG evaluate for seizures. Level of alertness: Awake AEDs during EEG study: Lorazepam Technical aspects: This EEG study was done with scalp electrodes positioned according to the 10-20 International system of electrode placement. Electrical activity was acquired at a sampling rate of 500Hz  and reviewed  with a high frequency filter of 70Hz  and a low frequency filter of 1Hz . EEG data were recorded continuously and digitally stored. Description: The posterior dominant rhythm consists of 6-7 Hz activity of moderate voltage (25-35 uV) seen predominantly in posterior head regions, symmetric and reactive to eye opening and eye closing.  EEG also showed continuous generalized 3 to 6 Hz theta-delta slowing.   Hyperventilation and photic stimulation were not performed. Abnormality -Background slow -Continued slow, generalized IMPRESSION: This study is suggestive of mild to moderate diffuse encephalopathy, nonspecific etiology. No seizures or epileptiform discharges were seen throughout the recording. Lora Havens   CT Head Wo Contrast  Result Date: 12/12/2019 CLINICAL DATA:  Altered mental status.  Decreased appetite. EXAM: CT HEAD WITHOUT CONTRAST TECHNIQUE: Contiguous axial images were obtained from the base of the skull through the vertex without intravenous contrast. COMPARISON:  12/03/2019 FINDINGS: Brain: No apparent change. Chronic small-vessel ischemic changes of the cerebral hemispheric white matter. No sign of acute infarction, mass lesion, hemorrhage, hydrocephalus or extra-axial collection. Vascular: There is atherosclerotic calcification of the major vessels at the base of the brain. Skull: Negative Sinuses/Orbits:  Clear/normal Other: None IMPRESSION: No acute finding. Chronic small-vessel ischemic changes of the white matter, similar to the previous examination Electronically Signed   By: Nelson Chimes M.D.   On: 12/12/2019 23:10   MR BRAIN WO CONTRAST  Result Date: 12/15/2019 CLINICAL DATA:  Altered mental status EXAM: MRI HEAD WITHOUT CONTRAST TECHNIQUE: Multiplanar, multiecho pulse sequences of the brain and surrounding structures were obtained without intravenous contrast. COMPARISON:  None. FINDINGS: Brain: There is no acute infarction or intracranial hemorrhage. There is no intracranial mass, mass effect, or edema. There is no hydrocephalus or extra-axial fluid collection. Patchy and confluent T2 hyperintensity in the supratentorial and pontine white matter is nonspecific but probably reflects moderate to marked chronic microvascular ischemic changes. Ventricles and sulci are within normal limits in size and configuration. Vascular: Major vessel flow voids at the skull base are preserved. Skull and upper cervical spine: Normal marrow signal is preserved. Sinuses/Orbits: Trace mucosal thickening.  Orbits are unremarkable. Other: Sella is unremarkable.  Mastoid air cells are clear. IMPRESSION: No acute infarction, hemorrhage, or mass. Moderate to marked chronic microvascular ischemic changes. Electronically Signed   By: Macy Mis M.D.   On: 12/15/2019 15:42   DG Chest Port 1 View  Result Date: 12/15/2019 CLINICAL DATA:  Fatigue, somnolence, confusion, hypertension EXAM: PORTABLE CHEST 1 VIEW COMPARISON:  12/13/2019 FINDINGS: Single frontal view of the chest demonstrates a stable cardiac silhouette. Chronic interstitial scarring without airspace disease, effusion, or pneumothorax. No acute bony abnormality. IMPRESSION: 1. Stable exam, no acute process. Electronically Signed   By: Randa Ngo M.D.   On: 12/15/2019 19:16   DG CHEST PORT 1 VIEW  Result Date: 12/13/2019 CLINICAL DATA:  Altered level of  consciousness EXAM: PORTABLE CHEST 1 VIEW COMPARISON:  11/01/2010 FINDINGS: Single frontal view of the chest demonstrates an unremarkable cardiac silhouette. No airspace disease, effusion, or pneumothorax. Minimal scarring at the lung bases. No acute bony abnormalities. IMPRESSION: 1. No acute intrathoracic process. Electronically Signed   By: Randa Ngo M.D.   On: 12/13/2019 02:22   DG Abd Portable 1V  Result Date: 12/14/2019 CLINICAL DATA:  Diarrhea.  COVID positive EXAM: PORTABLE ABDOMEN - 1 VIEW COMPARISON:  None. FINDINGS: Normal bowel gas pattern. Moderate stool volume without rectal over distension. No concerning mass effect or calcification. Extensive lumbar spine fusion extending to the pelvis with apparent  pelvic screw loosening on the left. IMPRESSION: Normal bowel gas pattern. Electronically Signed   By: Monte Fantasia M.D.   On: 12/14/2019 09:01    Micro Results    Recent Results (from the past 240 hour(s))  SARS CORONAVIRUS 2 (TAT 6-24 HRS) Nasopharyngeal Nasopharyngeal Swab     Status: Abnormal   Collection Time: 12/13/19  2:32 AM   Specimen: Nasopharyngeal Swab  Result Value Ref Range Status   SARS Coronavirus 2 POSITIVE (A) NEGATIVE Final    Comment: RESULT CALLED TO, READ BACK BY AND VERIFIED WITH: J. BLUE, [facility] ED CHG RN AT [phone] ON 12/13/19 BY C. JESSUP, MT. (NOTE) SARS-CoV-2 target nucleic acids are DETECTED. The SARS-CoV-2 RNA is generally detectable in upper and lower respiratory specimens during the acute phase of infection. Positive results are indicative of the presence of SARS-CoV-2 RNA. Clinical correlation with patient history and other diagnostic information is  necessary to determine patient infection status. Positive results do not rule out bacterial infection or co-infection with other viruses.  The expected result is Negative. Fact Sheet for Patients: SugarRoll.be Fact Sheet for Healthcare  Providers: https://www.woods-mathews.com/ This test is not yet approved or cleared by the [place] FDA and  has been authorized for detection and/or diagnosis of SARS-CoV-2 by FDA under an Emergency Use Authorization (EUA). This EUA will remain  in effect (meaning this te st can be used) for the duration of the COVID-19 declaration under Section 564(b)(1) of the Act, 21 U.S.C. section 360bbb-3(b)(1), unless the authorization is terminated or revoked sooner. Performed at [facility], [place] [address] [place], [place] [address]        Today   Subjective:   Jill Larson today has no headache,no chest or  abdominal pain, she is happy to go home.  Objective:   Blood pressure 136/71, pulse 86, temperature 97.6 F (36.4 C), resp. rate (!) 22, height 5\' 4"  (1.626 m), weight 51.7 kg, SpO2 100 %.   Intake/Output Summary (Last 24 hours) at 12/17/2019 1406 Last data filed at 12/17/2019 0800 Gross per 24 hour  Intake 600 ml  Output --  Net 600 ml    Exam Awake Alert, more appropriate and coherent today. Symmetrical Chest wall movement, Good air movement bilaterally, CTAB RRR,No Gallops,Rubs or new Murmurs, No Parasternal Heave +ve B.Sounds, Abd Soft, Non tender, No rebound -guarding or rigidity. No Cyanosis, Clubbing or edema, No new Rash or bruise  Data Review   CBC w Diff:  Lab Results  Component Value Date   WBC 6.0 12/17/2019   HGB 11.1 (L) 12/17/2019   HCT 31.6 (L) 12/17/2019   PLT 229 12/17/2019   LYMPHOPCT 37 12/17/2019   MONOPCT 7 12/17/2019   EOSPCT 7 12/17/2019   BASOPCT 1 12/17/2019    CMP:  Lab Results  Component Value Date   NA 140 12/17/2019   K 3.5 12/17/2019   CL 107 12/17/2019   CO2 24 12/17/2019   BUN 13 12/17/2019   CREATININE 0.74 12/17/2019   PROT 5.8 (L) 12/17/2019   ALBUMIN 2.9 (L) 12/17/2019   BILITOT 0.2 (L) 12/17/2019   ALKPHOS 100 12/17/2019   AST 23 12/17/2019   ALT 20 12/17/2019  .   Total Time in  preparing paper work, data evaluation and todays exam - 23 minutes  Phillips Climes M.D on 12/17/2019 at 2:06 PM  [facility]   Office  [phone]

## 2019-12-17 NOTE — Care Management Important Message (Signed)
Important Message  Patient Details  Name: Jill Larson MRN: [id number] Date of Birth: May 18, 1941   Medicare Important Message Given:  Yes - Important Message mailed due to current National Emergency  Verbal consent obtained due to current National Emergency  Relationship to patient: Child Contact Name: [name] Call Date: 12/17/19  Time: 1515 Phone: [phone] Outcome: Spoke with contact Important Message mailed to: Other (must enter comment)(Per daughter additional IM not needed. Is aware one is available if needed.)    Delorse Lek 12/17/2019, 3:16 PM

## 2020-08-29 NOTE — Progress Notes (Deleted)
Cardiology Office Note:    Date:  08/29/2020   ID:  Jill Larson, DOB 05-31-1941, MRN [id number]  PCP:  Imagene Riches, NP  Cardiologist:  Shirlee More, MD   Referring MD: Imagene Riches, NP  ASSESSMENT:    No diagnosis found. PLAN:    In order of problems listed above:  1. ***  Next appointment   Medication Adjustments/Labs and Tests Ordered: Current medicines are reviewed at length with the patient today.  Concerns regarding medicines are outlined above.  No orders of the defined types were placed in this encounter.  No orders of the defined types were placed in this encounter.    No chief complaint on file. ***  History of Present Illness:    Jill Larson is a 79 y.o. female who is being seen today for the evaluation of *** at the request of York, Regina F, NP. She was admitted to [facility] February with altered mental status encephalopathy and serotonin syndrome. EKG 12/12/2019 sinus rhythm normal no AV block or bradycardia Past Medical History:  Diagnosis Date  . Anxiety   . Benign fundic gland polyps of stomach 2001  . DDD (degenerative disc disease)   . Depression   . Depression   . Fibromyalgia   . Hyperlipidemia   . Hypertension   . Mitral valve prolapse   . Osteoporosis   . Scoliosis     Past Surgical History:  Procedure Laterality Date  . BACK SURGERY    . BREAST EXCISIONAL BIOPSY Right pt unsure  . BREAST SURGERY     right to remove calcium deposits  . TONSILLECTOMY AND ADENOIDECTOMY    . TUBAL LIGATION      Current Medications: No outpatient medications have been marked as taking for the 08/30/20 encounter (Appointment) with Richardo Priest, MD.     Allergies:   Patient has no known allergies.   Social History   Socioeconomic History  . Marital status: Married    Spouse name: Not on file  . Number of children: 2  . Years of education: Not on file  . Highest education level: Not on file  Occupational History  .  Occupation: Retired Licensed conveyancer  Tobacco Use  . Smoking status: Former Smoker    Types: Cigarettes  . Smokeless tobacco: Never Used  Substance and Sexual Activity  . Alcohol use: Yes    Comment: Social  . Drug use: No  . Sexual activity: Not on file  Other Topics Concern  . Not on file  Social History Narrative  . Not on file   Social Determinants of Health   Financial Resource Strain:   . Difficulty of Paying Living Expenses: Not on file  Food Insecurity:   . Worried About Charity fundraiser in the Last Year: Not on file  . Ran Out of Food in the Last Year: Not on file  Transportation Needs:   . Lack of Transportation (Medical): Not on file  . Lack of Transportation (Non-Medical): Not on file  Physical Activity:   . Days of Exercise per Week: Not on file  . Minutes of Exercise per Session: Not on file  Stress:   . Feeling of Stress : Not on file  Social Connections:   . Frequency of Communication with Friends and Family: Not on file  . Frequency of Social Gatherings with Friends and Family: Not on file  . Attends Religious Services: Not on file  . Active Member of Clubs or  Organizations: Not on file  . Attends Archivist Meetings: Not on file  . Marital Status: Not on file     Family History: The patient's ***family history includes Hypertension in her father; Pancreatic cancer in her sister. There is no history of Colon cancer.  ROS:   ROS Please see the history of present illness.    *** All other systems reviewed and are negative.  EKGs/Labs/Other Studies Reviewed:    The following studies were reviewed today: ***  EKG:  EKG is *** ordered today.  The ekg ordered today is personally reviewed and demonstrates ***  Recent Labs: 12/17/2019: ALT 20; B Natriuretic Peptide 34.4; BUN 13; Creatinine, Ser 0.74; Hemoglobin 11.1; Magnesium 2.1; Platelets 229; Potassium 3.5; Sodium 140  Recent Lipid Panel    Component Value Date/Time   CHOL 167 11/14/2010  1155   TRIG 40.0 11/14/2010 1155   TRIG 42 08/11/2006 0947   HDL 76.10 11/14/2010 1155   CHOLHDL 2 11/14/2010 1155   VLDL 8.0 11/14/2010 1155   LDLCALC 83 11/14/2010 1155    Physical Exam:    VS:  There were no vitals taken for this visit.    Wt Readings from Last 3 Encounters:  12/16/19 113 lb 15.7 oz (51.7 kg)  08/26/19 114 lb (51.7 kg)  04/20/19 120 lb (54.4 kg)     GEN: *** Well nourished, well developed in no acute distress HEENT: Normal NECK: No JVD; No carotid bruits LYMPHATICS: No lymphadenopathy CARDIAC: ***RRR, no murmurs, rubs, gallops RESPIRATORY:  Clear to auscultation without rales, wheezing or rhonchi  ABDOMEN: Soft, non-tender, non-distended MUSCULOSKELETAL:  No edema; No deformity  SKIN: Warm and dry NEUROLOGIC:  Alert and oriented x 3 PSYCHIATRIC:  Normal affect     Signed, Shirlee More, MD  08/29/2020 9:01 AM    [place]

## 2020-08-30 ENCOUNTER — Ambulatory Visit: Payer: Medicare Other | Admitting: Cardiology

## 2020-08-31 DIAGNOSIS — F419 Anxiety disorder, unspecified: Secondary | ICD-10-CM | POA: Insufficient documentation

## 2020-08-31 DIAGNOSIS — M797 Fibromyalgia: Secondary | ICD-10-CM | POA: Insufficient documentation

## 2020-08-31 DIAGNOSIS — I341 Nonrheumatic mitral (valve) prolapse: Secondary | ICD-10-CM | POA: Insufficient documentation

## 2020-08-31 DIAGNOSIS — F32A Depression, unspecified: Secondary | ICD-10-CM | POA: Insufficient documentation

## 2020-08-31 DIAGNOSIS — M419 Scoliosis, unspecified: Secondary | ICD-10-CM | POA: Insufficient documentation

## 2020-08-31 DIAGNOSIS — I10 Essential (primary) hypertension: Secondary | ICD-10-CM | POA: Insufficient documentation

## 2020-08-31 DIAGNOSIS — W19XXXA Unspecified fall, initial encounter: Secondary | ICD-10-CM

## 2020-08-31 DIAGNOSIS — R001 Bradycardia, unspecified: Secondary | ICD-10-CM

## 2020-08-31 DIAGNOSIS — E785 Hyperlipidemia, unspecified: Secondary | ICD-10-CM | POA: Insufficient documentation

## 2020-08-31 DIAGNOSIS — M810 Age-related osteoporosis without current pathological fracture: Secondary | ICD-10-CM | POA: Insufficient documentation

## 2020-08-31 HISTORY — DX: Bradycardia, unspecified: R00.1

## 2020-08-31 HISTORY — DX: Unspecified fall, initial encounter: W19.XXXA

## 2020-09-04 NOTE — Progress Notes (Signed)
Cardiology Office Note:    Date:  09/05/2020   ID:  Jill Larson, DOB Feb 27, 1941, MRN [id number]  PCP:  Imagene Riches, NP  Cardiologist:  Shirlee More, MD   Referring MD: Imagene Riches, NP  ASSESSMENT:    1. Sinus bradycardia   2. Multiple falls   3. Tremor    PLAN:    In order of problems listed above:  1. Her presentation includes atypical episodes of fall without loss of consciousness associated with sinus bradycardia not severe or sustained.  She is improved she is off of beta-blocker heart rate response is better and I think the prudent course here is not to resume 1 and if she has recurrent episodes in the future consider an implanted loop recorder.  Her episodes are so atypical that I would do a EEG before an implanted loop recorder.  Also avoid rate slowing medications in the future calcium channel blockers beta-blockers digitalis and beta-blocker eyedrops 2. She had discussed with her primary care physician other treatment?  Mysoline.  Next appointment with me as needed, advised her daughter to purchase a blood pressure cuff screen her mother daily notify me or PCP if she is having hypotension or heart rates less than 50.   Medication Adjustments/Labs and Tests Ordered: Current medicines are reviewed at length with the patient today.  Concerns regarding medicines are outlined above.  Orders Placed This Encounter  Procedures  . EKG 12-Lead   No orders of the defined types were placed in this encounter.    Chief Complaint  Patient presents with  . Follow-up  . Bradycardia    History of Present Illness:    Jill Larson is a 79 y.o. female who is being seen today for the evaluation of bradycardia at the request of York, Regina F, NP.  I reviewed the primary care physician note and said she is in [place] emergency room 08/01/2020 and discharged with a diagnosis of bradycardia.  There is a EKG from 07/18/2020 showing sinus rhythm rate approximately 60 bpm  otherwise normal.  A 7-day event monitor was performed end of service 07/24/2020 showing rare ventricular and supraventricular ectopy average heart rate 60 minimum rate 50 maximum rate 120 sinus rhythm all the time there were no pauses no episodes of atrial fibrillation or flutter.  There were 10 triggered events present.  1 associated with the PVC 1 associated with an APC and 1 associated with sinus bradycardia rate of 53 bpm.  Chart review: [place] ED 08/02/2020 shows sinus rhythm at a rate of 47 bpm its otherwise normal PR interval QRS duration and QRS morphology are all normal.  ED record 08/02/2020 chief complaint was fall that occurred 4-day previously triage says she was referred to the emergency room for CT imaging.  Heart rate from vital signs 56 bpm blood pressure 177/79 laboratory studies include a normal CBC hemoglobin was 13.4 creatinine 0.7 GFR greater than 60 cc potassium 3.9 she had a troponin assessed that was normal there is a notation by the ER doctor that she had asymptomatic sinus bradycardia with the rates in the high 40s while she was in the emergency room he expressed a concern that her falls are due to arrhythmia and after decision with the family decided to refer her to cardiology for evaluation.  She had an EKG in the emergency room showing no acute intracranial abnormality. She had an ED [facility] ED 04/20/2019 with syncope.  Her EKG showed sinus rhythm  low voltage T beats per minute otherwise normal she is found to have orthostatic hypotension and was discharged from the hospital. She is admitted to the hospital 12/12/2019 with altered mental status and serotonin syndrome.  She was also Covid acute infection with elevated CRP and D-dimer..  Strips reviewed sinus rhythm no arrhythmia or bradycardia.   Her daughter is present she is a CMA participate in the evaluation and decision-making. Prior to the ED she had 2 episodes of abrupt loss of strength and  falls without loss of consciousness.  Her beta-blocker was stopped and she has had no recurrence. Unfortunately should be on a beta-blocker long-term for tremor and is worsened.  She inquires whether she can restart. She has had no chest pain shortness of breath or palpitation. She has no history of seizure disorder the episodes were witnessed there was no alteration in consciousness and no ictal activity. She has no known heart disease. Past Medical History:  Diagnosis Date  . Accidental fall 08/31/2020  . Acute encephalopathy 12/13/2019  . Altered mental status 12/13/2019  . Anxiety   . ANXIETY 04/18/2008   Qualifier: Diagnosis of  By: Julien Girt CMA, Marliss Czar    . BACK PAIN, LUMBAR 04/18/2008   Qualifier: Diagnosis of  By: Julien Girt CMA, Marliss Czar    . Benign fundic gland polyps of stomach 2001  . Bradycardia 08/31/2020  . Chronic LBP 03/11/2015  . Clinical depression 03/11/2015  . CONSTIPATION 04/18/2008   Qualifier: Diagnosis of  By: Julien Girt CMA, Marliss Czar    . DDD (degenerative disc disease)   . Depression   . Depression   . Fibromyalgia   . FIBROMYALGIA 04/18/2008   Qualifier: Diagnosis of  By: Julien Girt CMA, Marliss Czar    . GASTRIC POLYP 06/19/2008   Qualifier: History of  By: Lenna Gilford MD, Deborra Medina   . HYPERCHOLESTEROLEMIA 04/18/2008   Qualifier: Diagnosis of  By: Julien Girt CMA, Marliss Czar    . Hyperlipidemia   . Hypertension   . HYPERTENSION, BORDERLINE 04/18/2008   Qualifier: Diagnosis of  By: Julien Girt CMA, Marliss Czar    . Intestinal obstruction due to decreased peristalsis ([place]) 03/11/2015  . Mitral valve prolapse   . MITRAL VALVE PROLAPSE, HX OF 04/18/2008   Qualifier: Diagnosis of  By: Julien Girt CMA, Marliss Czar    . Osteoporosis   . OSTEOPOROSIS 04/18/2008   Qualifier: Diagnosis of  By: Julien Girt CMA, Marliss Czar     . Palpitations 06/14/2008   Qualifier: Diagnosis of  By: Lenna Gilford MD, Deborra Medina   . Pulmonary embolism ([place]) 03/11/2015  . Scoliosis   . 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  . SPL (spondylolisthesis) 03/11/2015    Past Surgical  History:  Procedure Laterality Date  . BACK SURGERY    . BREAST EXCISIONAL BIOPSY Right pt unsure  . BREAST SURGERY     right to remove calcium deposits  . TONSILLECTOMY AND ADENOIDECTOMY    . TUBAL LIGATION      Current Medications: Current Meds  Medication Sig  . ALPRAZolam (XANAX) 1 MG tablet Take 1 mg by mouth 3 (three) times daily as needed.   Marland Kitchen b complex vitamins capsule Take 1 capsule by mouth daily.  . Cholecalciferol (VITAMIN D PO) Take 1 capsule by mouth in the morning and at bedtime.   . Omega-3 Fatty Acids (FISH OIL) 1000 MG CAPS Take 1 capsule by mouth daily.   Marland Kitchen VRAYLAR capsule Take 1.5 mg by mouth daily.     Allergies:   Patient has no known allergies.   Social History  Socioeconomic History  . Marital status: Married    Spouse name: Not on file  . Number of children: 2  . Years of education: Not on file  . Highest education level: Not on file  Occupational History  . Occupation: Retired Licensed conveyancer  Tobacco Use  . Smoking status: Former Smoker    Types: Cigarettes  . Smokeless tobacco: Never Used  Substance and Sexual Activity  . Alcohol use: Yes    Comment: Social  . Drug use: No  . Sexual activity: Not on file  Other Topics Concern  . Not on file  Social History Narrative  . Not on file   Social Determinants of Health   Financial Resource Strain:   . Difficulty of Paying Living Expenses: Not on file  Food Insecurity:   . Worried About Charity fundraiser in the Last Year: Not on file  . Ran Out of Food in the Last Year: Not on file  Transportation Needs:   . Lack of Transportation (Medical): Not on file  . Lack of Transportation (Non-Medical): Not on file  Physical Activity:   . Days of Exercise per Week: Not on file  . Minutes of Exercise per Session: Not on file  Stress:   . Feeling of Stress : Not on file  Social Connections:   . Frequency of Communication with Friends and Family: Not on file  . Frequency of Social Gatherings with  Friends and Family: Not on file  . Attends Religious Services: Not on file  . Active Member of Clubs or Organizations: Not on file  . Attends Archivist Meetings: Not on file  . Marital Status: Not on file     Family History: The patient's family history includes Hypertension in her father; Pancreatic cancer in her sister. There is no history of Colon cancer.  ROS:   ROS Please see the history of present illness.     All other systems reviewed and are negative.  EKGs/Labs/Other Studies Reviewed:    The following studies were reviewed today:     Recent Labs: 12/17/2019: ALT 20; B Natriuretic Peptide 34.4; BUN 13; Creatinine, Ser 0.74; Hemoglobin 11.1; Magnesium 2.1; Platelets 229; Potassium 3.5; Sodium 140  Recent Lipid Panel    Component Value Date/Time   CHOL 167 11/14/2010 1155   TRIG 40.0 11/14/2010 1155   TRIG 42 08/11/2006 0947   HDL 76.10 11/14/2010 1155   CHOLHDL 2 11/14/2010 1155   VLDL 8.0 11/14/2010 1155   LDLCALC 83 11/14/2010 1155    Physical Exam:    VS:  BP 132/80   Pulse 76   Ht 5\' 1"  (1.549 m)   Wt 113 lb 1.3 oz (51.3 kg)   SpO2 98%   BMI 21.37 kg/m     Wt Readings from Last 3 Encounters:  09/05/20 113 lb 1.3 oz (51.3 kg)  12/16/19 113 lb 15.7 oz (51.7 kg)  08/26/19 114 lb (51.7 kg)     GEN:  Well nourished, well developed in no acute distress HEENT: Normal NECK: No JVD; No carotid bruits LYMPHATICS: No lymphadenopathy CARDIAC: RRR, no murmurs, rubs, gallops RESPIRATORY:  Clear to auscultation without rales, wheezing or rhonchi  ABDOMEN: Soft, non-tender, non-distended MUSCULOSKELETAL:  No edema; No deformity  SKIN: Warm and dry NEUROLOGIC:  Alert and oriented x 3 PSYCHIATRIC:  Normal affect     Signed, Shirlee More, MD  09/05/2020 4:52 PM    [facility]

## 2020-09-05 ENCOUNTER — Encounter: Payer: Self-pay | Admitting: Cardiology

## 2020-09-05 ENCOUNTER — Other Ambulatory Visit: Payer: Self-pay

## 2020-09-05 ENCOUNTER — Ambulatory Visit: Payer: Medicare Other | Admitting: Cardiology

## 2020-09-05 VITALS — BP 132/80 | HR 76 | Ht 61.0 in | Wt 113.1 lb

## 2020-09-05 DIAGNOSIS — R251 Tremor, unspecified: Secondary | ICD-10-CM | POA: Diagnosis not present

## 2020-09-05 DIAGNOSIS — R001 Bradycardia, unspecified: Secondary | ICD-10-CM | POA: Diagnosis not present

## 2020-09-05 DIAGNOSIS — R296 Repeated falls: Secondary | ICD-10-CM

## 2020-09-05 NOTE — Patient Instructions (Signed)

## 2020-09-06 ENCOUNTER — Ambulatory Visit: Payer: Medicare Other | Admitting: Cardiology

## 2020-12-20 ENCOUNTER — Other Ambulatory Visit: Payer: Self-pay

## 2020-12-20 ENCOUNTER — Ambulatory Visit (HOSPITAL_COMMUNITY)
Admission: EM | Admit: 2020-12-20 | Discharge: 2020-12-20 | Disposition: A | Discharge status: Home / Self Care | Payer: Medicare Other | Attending: Psychiatry | Admitting: Psychiatry

## 2020-12-20 NOTE — ED Provider Notes (Signed)
[facility] Medical Screening Exam  Patient Name: Jill Larson MRN: [id number] Date of Evaluation: 12/20/20 Chief Complaint:   Diagnosis:  Final diagnoses:  None    History of Present illness: Jill Larson is a 80 y.o. female. Patient left prior to evaluation by provider.     Rozetta Nunnery, NP 12/20/2020, 9:21 PM

## 2020-12-20 NOTE — BH Assessment (Signed)
Per daughter, pt wants to leave and come back in the morning to go to [facility]. Clinician spoke to Lindon Romp, PMHNP to express pt is not SI/HI and wants to come to [facility] for medication management for anxiety symptoms. PMHNP  suggested the to be to at [facility] by 730 to register.    Vertell Novak, [place], [facility], [facility] Triage Specialist [phone]

## 2020-12-20 NOTE — Progress Notes (Signed)
Leon was assessed per policy and discharged without incident.

## 2020-12-20 NOTE — BH Assessment (Addendum)
TTS Triage:  Jill Larson is a 80 yo female presenting to [facility] for assessment of escalating anxiety symptoms. Pt is accompanied by her daughter and another female family member.  Pt rates her anxiety as a "9" on a scale of 1 to 10 with 10 being the most intense. Pt states that her primary care physician recently changed her medication (Vraylar) to Zoloft and her anxiety has been escalating ever since. (exacerbation of mental illness symptoms).   Pts primary care physician manages medication currently and pt reports that she has appt with [facility] to manage psychiatric needs 12/28/20.   Pt hospitalized in 2021 for AMS.  Pt reports good quality and quantity of sleep and appetite is within normal limits.  Pt denies any SI, HI, or AVH.  Pt does not feel she is a danger to herself or others.  Pt is visibly shaky throughout triage.   Clinical Triage: Routine  Jeanmarie Plant, MSW, LCSW Outpatient Therapist/Triage Specialist

## 2021-02-15 IMAGING — DX DG ABD PORTABLE 1V
1 series · 1 of 1 positions shown · non-contrast
Comparison: None.

CLINICAL DATA: Diarrhea.  COVID positive

EXAM:
PORTABLE ABDOMEN - 1 VIEW

[abdomen kub]
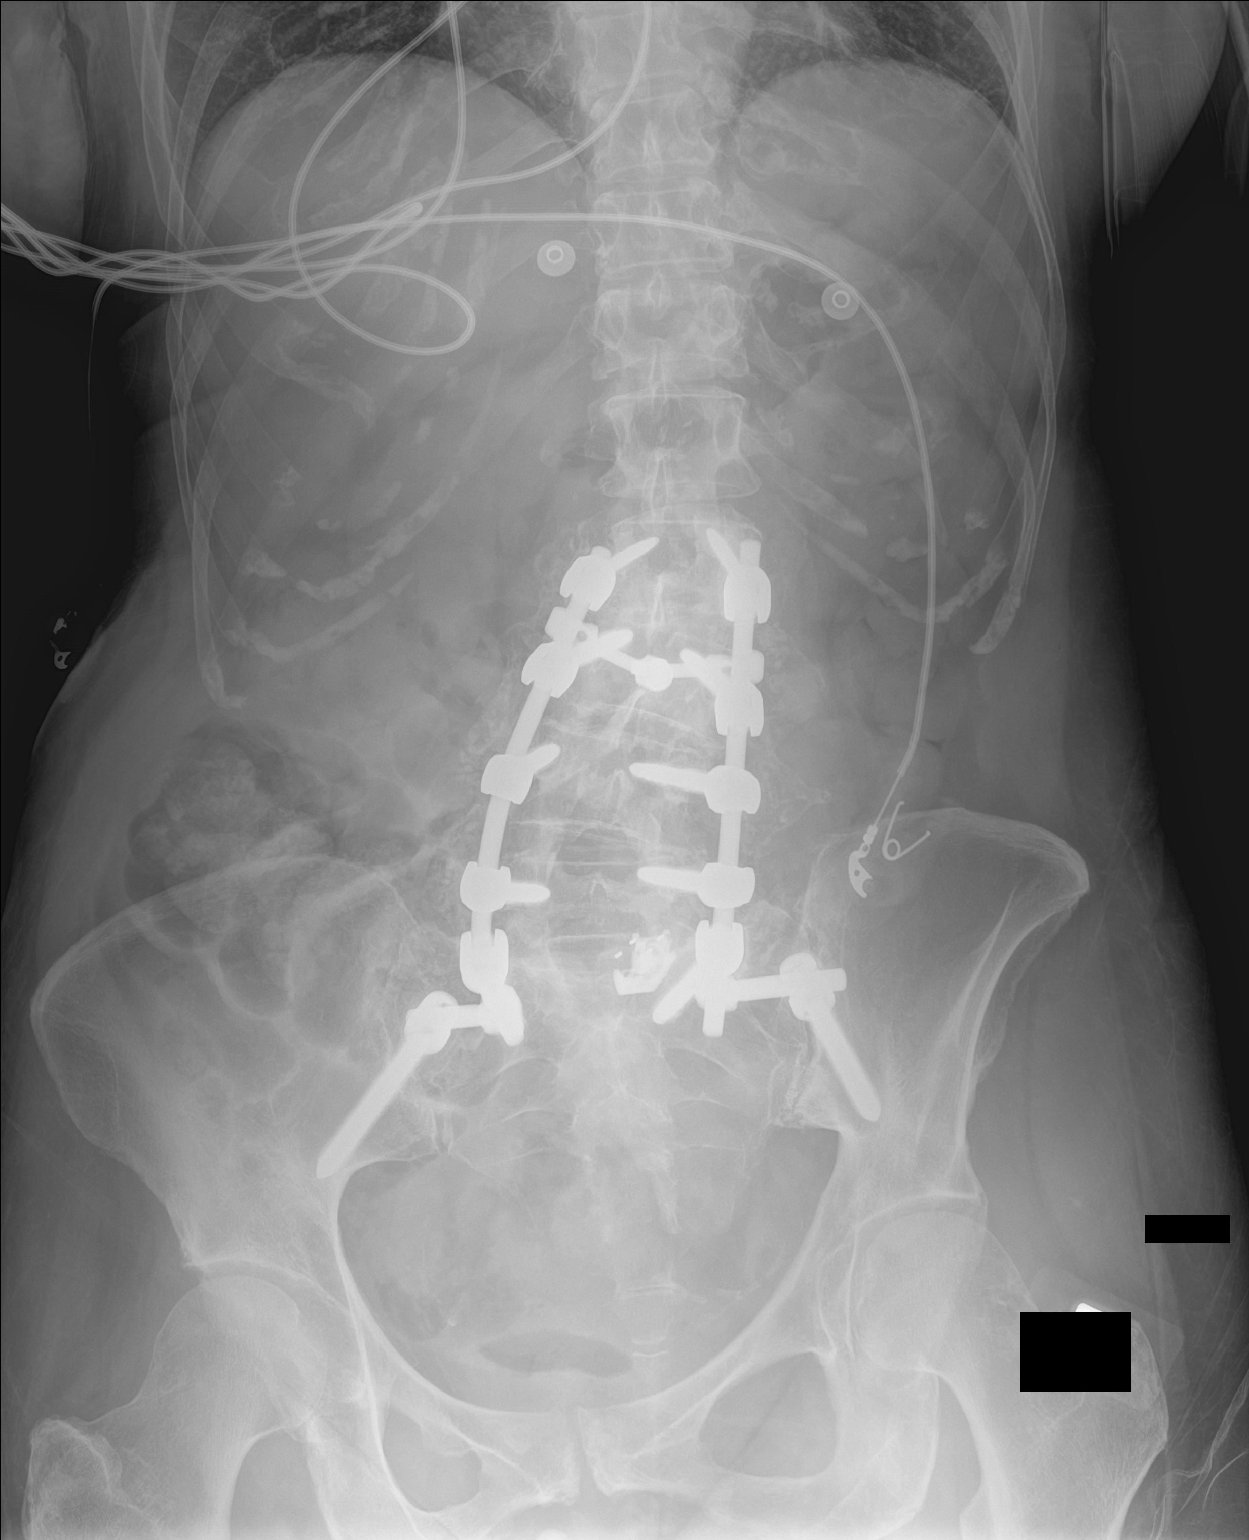

[1 of 1 positions shown; findings below may reference images not displayed]

FINDINGS: Normal bowel gas pattern. Moderate stool volume without rectal over
distension. No concerning mass effect or calcification.

Extensive lumbar spine fusion extending to the pelvis with apparent
pelvic screw loosening on the left.
IMPRESSION: Normal bowel gas pattern.

## 2021-02-16 IMAGING — DX DG CHEST 1V PORT
1 series · 1 of 1 positions shown · non-contrast
Comparison: 12/13/2019

CLINICAL DATA: Fatigue, somnolence, confusion, hypertension

EXAM:
PORTABLE CHEST 1 VIEW

[chest ap]
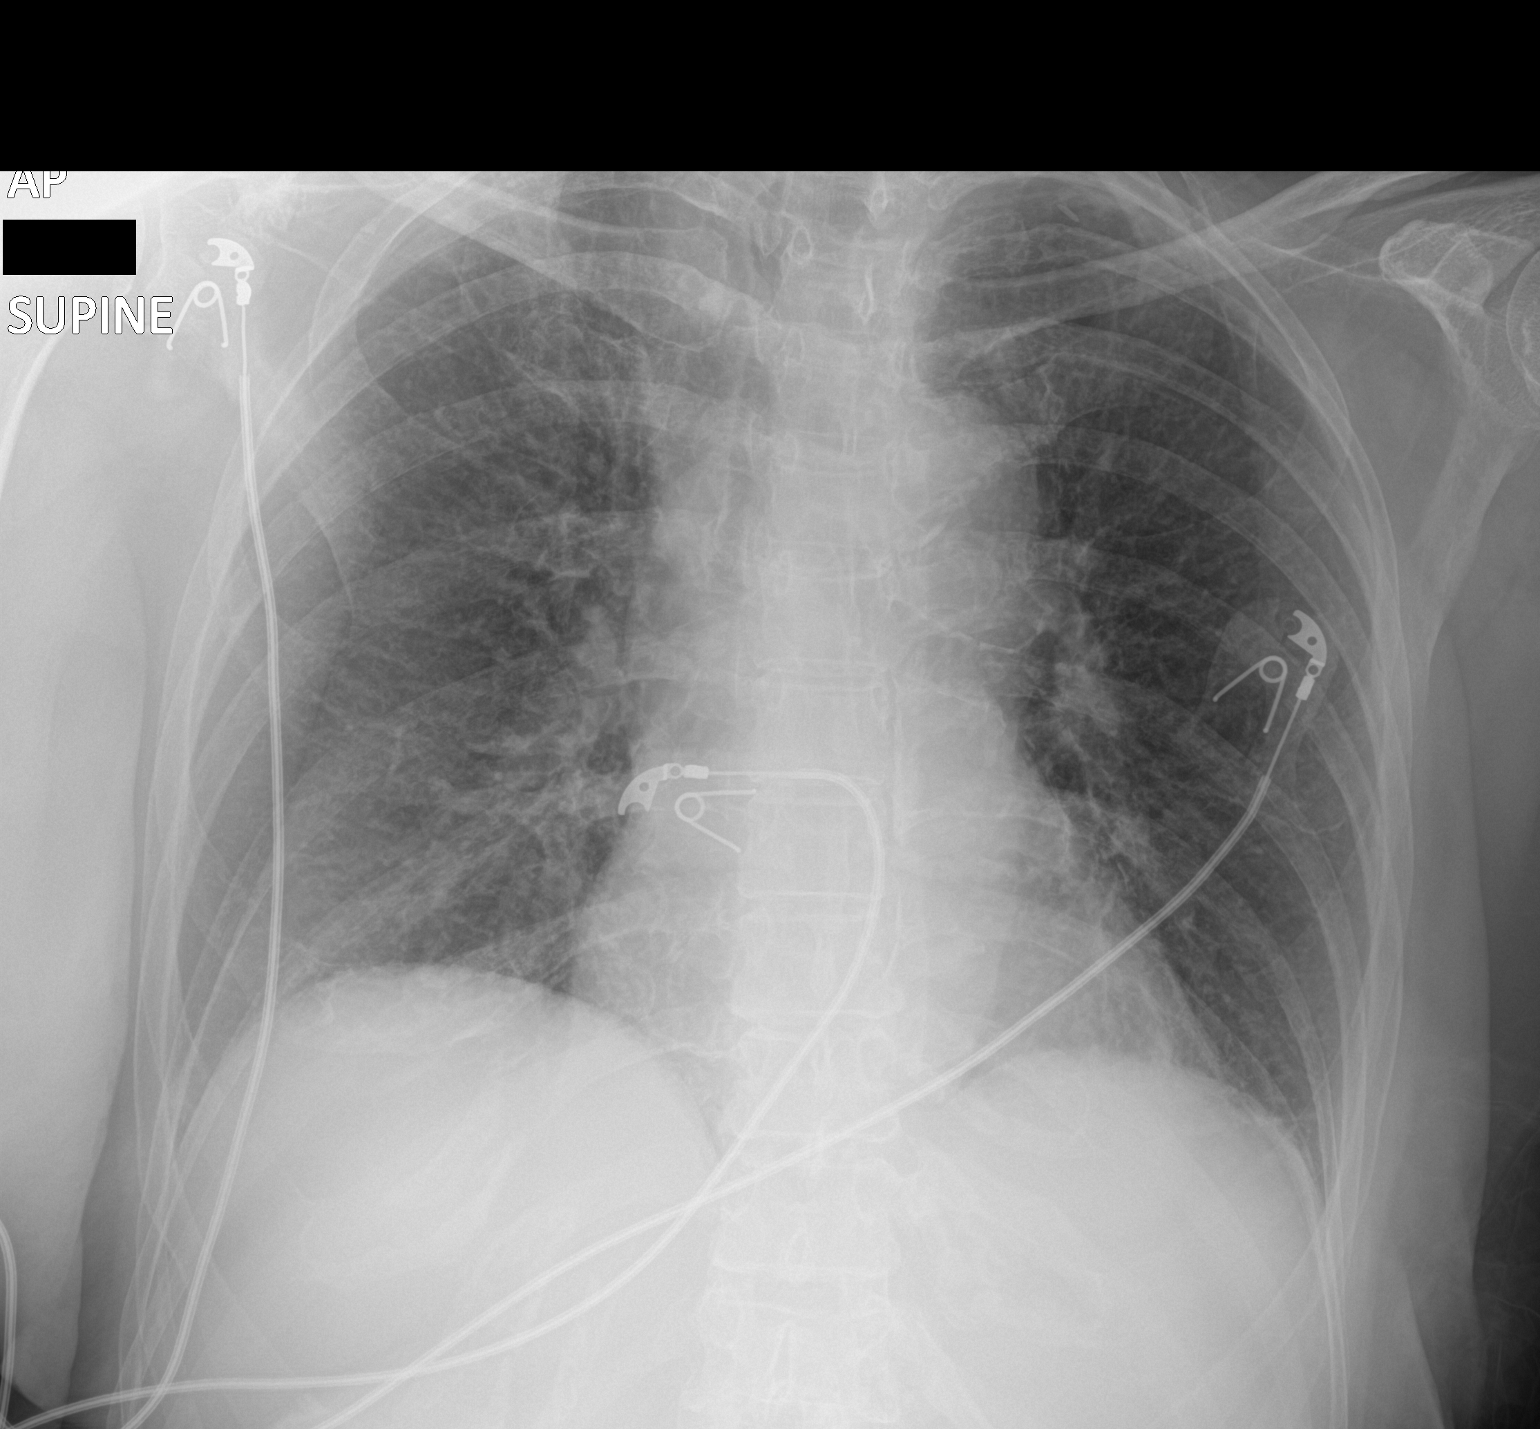

[1 of 1 positions shown; findings below may reference images not displayed]

FINDINGS: Single frontal view of the chest demonstrates a stable cardiac
silhouette. Chronic interstitial scarring without airspace disease,
effusion, or pneumothorax. No acute bony abnormality.
IMPRESSION: 1. Stable exam, no acute process.

## 2021-04-30 ENCOUNTER — Ambulatory Visit (INDEPENDENT_AMBULATORY_CARE_PROVIDER_SITE_OTHER): Payer: Medicare Other | Admitting: Podiatry

## 2021-04-30 DIAGNOSIS — Z5329 Procedure and treatment not carried out because of patient's decision for other reasons: Secondary | ICD-10-CM

## 2021-05-17 ENCOUNTER — Ambulatory Visit (INDEPENDENT_AMBULATORY_CARE_PROVIDER_SITE_OTHER): Payer: Medicare Other | Admitting: Podiatry

## 2021-05-17 DIAGNOSIS — Z5329 Procedure and treatment not carried out because of patient's decision for other reasons: Secondary | ICD-10-CM

## 2021-05-17 NOTE — Progress Notes (Signed)
No show for appt. 

## 2021-05-18 ENCOUNTER — Ambulatory Visit: Payer: Medicare Other | Admitting: Sports Medicine

## 2021-05-28 DIAGNOSIS — D513 Other dietary vitamin B12 deficiency anemia: Secondary | ICD-10-CM | POA: Insufficient documentation

## 2021-12-31 ENCOUNTER — Ambulatory Visit (INDEPENDENT_AMBULATORY_CARE_PROVIDER_SITE_OTHER): Payer: Medicare Other | Admitting: Podiatry

## 2021-12-31 ENCOUNTER — Other Ambulatory Visit: Payer: Self-pay

## 2021-12-31 DIAGNOSIS — M79675 Pain in left toe(s): Secondary | ICD-10-CM

## 2021-12-31 DIAGNOSIS — M79674 Pain in right toe(s): Secondary | ICD-10-CM

## 2021-12-31 DIAGNOSIS — Q828 Other specified congenital malformations of skin: Secondary | ICD-10-CM

## 2021-12-31 DIAGNOSIS — B351 Tinea unguium: Secondary | ICD-10-CM | POA: Diagnosis not present

## 2021-12-31 NOTE — Progress Notes (Signed)
Subjective:   Patient ID: Jill Larson, female   DOB: 81 y.o.   MRN: [id number]   HPI 82 year old female presents the office today for concerns of thick, elongated toenails that she cannot trim himself as well as for calluses.  She denies any open sores.  No swelling redness or any drainage.  She has no other concerns today.   Review of Systems  All other systems reviewed and are negative.  Past Medical History:  Diagnosis Date   Accidental fall 08/31/2020   Acute encephalopathy 12/13/2019   Altered mental status 12/13/2019   Anxiety    ANXIETY 04/18/2008   Qualifier: Diagnosis of  By: Renaldo Fiddler CMA, Rolley Sims PAIN, LUMBAR 04/18/2008   Qualifier: Diagnosis of  By: Renaldo Fiddler CMA, Leigh     Benign fundic gland polyps of stomach 2001   Bradycardia 08/31/2020   Chronic LBP 03/11/2015   Clinical depression 03/11/2015   CONSTIPATION 04/18/2008   Qualifier: Diagnosis of  By: Renaldo Fiddler CMA, Marliss Czar     DDD (degenerative disc disease)    Depression    Depression    Fibromyalgia    FIBROMYALGIA 04/18/2008   Qualifier: Diagnosis of  By: Renaldo Fiddler CMA, Leigh     GASTRIC POLYP 06/19/2008   Qualifier: History of  By: Kriste Basque MD, Lonzo Cloud    HYPERCHOLESTEROLEMIA 04/18/2008   Qualifier: Diagnosis of  By: Renaldo Fiddler CMA, Leigh     Hyperlipidemia    Hypertension    HYPERTENSION, BORDERLINE 04/18/2008   Qualifier: Diagnosis of  By: Renaldo Fiddler CMA, Leigh     Intestinal obstruction due to decreased peristalsis ([facility]) 03/11/2015   Mitral valve prolapse    MITRAL VALVE PROLAPSE, HX OF 04/18/2008   Qualifier: Diagnosis of  By: Renaldo Fiddler CMA, Leigh     Osteoporosis    OSTEOPOROSIS 04/18/2008   Qualifier: Diagnosis of  By: Renaldo Fiddler CMA, Leigh      Palpitations 06/14/2008   Qualifier: Diagnosis of  By: Kriste Basque MD, Lonzo Cloud    Pulmonary embolism ([facility]) 03/11/2015   Scoliosis    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   SPL (spondylolisthesis) 03/11/2015    Past Surgical History:  Procedure Laterality Date   BACK SURGERY     BREAST  EXCISIONAL BIOPSY Right pt unsure   BREAST SURGERY     right to remove calcium deposits   TONSILLECTOMY AND ADENOIDECTOMY     TUBAL LIGATION       Current Outpatient Medications:    ALPRAZolam (XANAX) 1 MG tablet, Take 1 mg by mouth 3 (three) times daily as needed. , Disp: , Rfl:    b complex vitamins capsule, Take 1 capsule by mouth daily., Disp: , Rfl:    Cholecalciferol (VITAMIN D PO), Take 1 capsule by mouth in the morning and at bedtime. , Disp: , Rfl:    Omega-3 Fatty Acids (FISH OIL) 1000 MG CAPS, Take 1 capsule by mouth daily. , Disp: , Rfl:    VRAYLAR capsule, Take 1.5 mg by mouth daily., Disp: , Rfl:   No Known Allergies       Objective:  Physical Exam  General: NAD  Dermatological: Nails are hypertrophic, dystrophic, brittle, discolored, elongated 10. No surrounding redness or drainage. Tenderness nails 1-5 bilaterally.  Hyperkeratotic lesion submetatarsal 2 without any underlying ulceration drainage or any signs of infection.  No open lesions or other pre-ulcerative lesions are identified today.   Vascular: Dorsalis Pedis artery and Posterior Tibial artery pedal pulses are palpable bilateral with immedate capillary fill time. There  is no pain with calf compression, swelling, warmth, erythema.   Neruologic: Grossly intact via light touch bilateral.   Musculoskeletal: No other areas of discomfort noted today.  Gait: Unassisted, Nonantalgic.       Assessment:   81 year old female with symptomatic onychomycosis, hyperkeratotic lesion     Plan:  -Treatment options discussed including all alternatives, risks, and complications -Etiology of symptoms were discussed -Nails debrided 10 without complications or bleeding. -Sharp debrided hyperkeratotic lesion x1 without any complications or bleeding -Daily foot inspection -Follow-up in 3 months or sooner if any problems arise. In the meantime, encouraged to call the office with any questions, concerns, change in  symptoms.   Ovid Curd, DPM

## 2022-03-07 ENCOUNTER — Ambulatory Visit: Payer: Medicare Other | Admitting: Podiatry

## 2022-03-14 ENCOUNTER — Ambulatory Visit: Payer: Medicare Other | Admitting: Podiatry

## 2022-03-14 DIAGNOSIS — Q828 Other specified congenital malformations of skin: Secondary | ICD-10-CM

## 2022-03-14 DIAGNOSIS — L600 Ingrowing nail: Secondary | ICD-10-CM | POA: Diagnosis not present

## 2022-03-14 DIAGNOSIS — M79674 Pain in right toe(s): Secondary | ICD-10-CM | POA: Diagnosis not present

## 2022-03-14 DIAGNOSIS — M79675 Pain in left toe(s): Secondary | ICD-10-CM | POA: Diagnosis not present

## 2022-03-14 DIAGNOSIS — B351 Tinea unguium: Secondary | ICD-10-CM

## 2022-03-14 MED ORDER — DOXYCYCLINE HYCLATE 100 MG PO TABS: 100.0000 mg | ORAL_TABLET | Freq: Two times a day (BID) | ORAL | 0 refills | Status: DC

## 2022-03-14 NOTE — Patient Instructions (Signed)

## 2022-03-17 NOTE — Progress Notes (Signed)
Subjective: 81 year old female presents the office today for concerns of thick, elongated toenails that she is not able to trim her self as well as for calluses.  She states the left second toe nail is causing discomfort and pain at the nail grows out again.  She wants to have the left second toenail removed permanently.  Also she gets calluses on her feet she states that she had an laser previously and was very helpful.  This was done by another provider.  No swelling redness or drainage.  Also states that she had a tick in between her toes on the left foot which she removed.  No open lesions.  Objective: AAO x3, NAD-presents with granddaughter DP/PT pulses palpable bilaterally, CRT less than 3 seconds Nails are hypertrophic, dystrophic, brittle, discolored, elongated 10. No surrounding redness or drainage. Tenderness nails 1-5 bilaterally.  In particular left second toenail is the most thickened and elongated and there is tenderness to the entire toenail.  There is no edema, erythema.  Hyperkeratotic lesions are noted submetatarsal 2 without any underlying ulceration drainage or signs of infection. No open lesions or pre-ulcerative lesions are identified today. Along the area where she had a tick in between her toes but able to identify any edema, erythema or any open lesions. No pain with calf compression, swelling, warmth, erythema  Assessment: Symptomatic onychomycosis, ingrown toenail/onychodystrophy left second toenail; hyperkeratotic lesions  Plan: -All treatment options discussed with the patient including all alternatives, risks, complications.  -At this time, the patient is requesting total nail removal with chemical matricectomy to the left second toenail given the ingrown, thickening and dystrophy of the nail.  Risks and complications were discussed with the patient for which they understand and written consent was obtained. Under sterile conditions a total of 3 mL of a mixture of 2%  lidocaine plain and 0.5% Marcaine plain was infiltrated in a digital block fashion. Once anesthetized, the skin was prepped in sterile fashion. A tourniquet was then applied.  The left second toenail was removed in total making sure remove all nail borders.  Once the nails were ensured to be removed area was debrided and the underlying skin was intact. There is no purulence identified in the procedure. Next phenol was then applied under standard conditions and copiously irrigated. Silvadene was applied. A dry sterile dressing was applied. After application of the dressing the tourniquet was removed and there is found to be an immediate capillary refill time to the digit. The patient tolerated the procedure well any complications. Post procedure instructions were discussed the patient for which he verbally understood.  Discussed signs/symptoms of infection and directed to call the office immediately should any occur or go directly to the emergency room. In the meantime, encouraged to call the office with any questions, concerns, changes symptoms. -Doxycycline -Sharply debrided nails x9 without any complications or bleeding -Hyperkeratotic lesion sharply debrided x2 without any complications or bleeding -Patient encouraged to call the office with any questions, concerns, change in symptoms.   Trula Slade DPM

## 2022-03-29 ENCOUNTER — Ambulatory Visit: Payer: Medicare Other | Admitting: Podiatry

## 2022-04-02 ENCOUNTER — Ambulatory Visit: Payer: Medicare Other | Admitting: Podiatry

## 2022-04-04 ENCOUNTER — Ambulatory Visit (INDEPENDENT_AMBULATORY_CARE_PROVIDER_SITE_OTHER): Payer: Medicare Other | Admitting: Podiatry

## 2022-04-04 DIAGNOSIS — L600 Ingrowing nail: Secondary | ICD-10-CM

## 2022-04-05 NOTE — Progress Notes (Signed)
Subjective: Jill Larson is a 81 y.o.  female returns to office today for follow up evaluation after having left 2nd total nail avulsion performed. Patient has been soaking using epsom salt and applying topical antibiotic covered with bandaid daily. Patient denies fevers, chills, nausea, vomiting. Denies any calf pain, chest pain, SOB.   Objective:  Vitals: Reviewed  General: Well developed, nourished, in no acute distress, alert and oriented x3   Dermatology: Skin is warm, dry and supple bilateral. left 2nd nail bed appears to be clean, dry, with mild granular tissue and surrounding scab. There is no surrounding erythema, edema, drainage/purulence. The remaining nails appear unremarkable at this time. There are no other lesions or other signs of infection present.  Neurovascular status: Intact. No lower extremity swelling; No pain with calf compression bilateral.  Musculoskeletal: Decreased tenderness to palpation of the left 2nd nail bed. Muscular strength within normal limits bilateral.   Assesement and Plan: S/p total nail avulsion, doing well.   -Continue soaking in epsom salts twice a day followed by antibiotic ointment and a band-aid. Can leave uncovered at night. Continue this until completely healed.  -If the area has not healed in 2 weeks, call the office for follow-up appointment, or sooner if any problems arise.  -Monitor for any signs/symptoms of infection. Call the office immediately if any occur or go directly to the emergency room. Call with any questions/concerns.  Boneta Lucks, DPM

## 2022-07-17 ENCOUNTER — Ambulatory Visit: Payer: Medicare Other | Admitting: Podiatry

## 2022-07-24 ENCOUNTER — Telehealth: Payer: Self-pay | Admitting: Podiatry

## 2022-07-25 ENCOUNTER — Ambulatory Visit: Payer: Medicare Other | Admitting: Podiatry

## 2022-07-25 NOTE — Telephone Encounter (Signed)
Called pt on 10/4 to r/s appt, daughter stated she was not there & pt would cb to r/s

## 2022-08-07 ENCOUNTER — Ambulatory Visit: Payer: Medicare Other | Admitting: Podiatry

## 2022-08-08 ENCOUNTER — Ambulatory Visit: Payer: Medicare Other | Admitting: Podiatry

## 2022-10-02 ENCOUNTER — Other Ambulatory Visit: Payer: Self-pay | Admitting: Family

## 2022-10-02 DIAGNOSIS — Z1231 Encounter for screening mammogram for malignant neoplasm of breast: Secondary | ICD-10-CM

## 2022-10-10 ENCOUNTER — Other Ambulatory Visit: Payer: Self-pay | Admitting: Family

## 2022-10-10 DIAGNOSIS — E2839 Other primary ovarian failure: Secondary | ICD-10-CM

## 2022-10-11 ENCOUNTER — Inpatient Hospital Stay: Admission: RE | Admit: 2022-10-11 | Payer: Medicare Other | Source: Ambulatory Visit

## 2022-10-11 ENCOUNTER — Other Ambulatory Visit: Payer: Medicare Other

## 2022-11-11 ENCOUNTER — Other Ambulatory Visit: Payer: Medicare Other

## 2022-11-15 ENCOUNTER — Ambulatory Visit: Payer: Medicare Other | Admitting: Podiatry

## 2022-11-27 ENCOUNTER — Encounter: Payer: Self-pay | Admitting: Gastroenterology

## 2022-12-18 ENCOUNTER — Ambulatory Visit (INDEPENDENT_AMBULATORY_CARE_PROVIDER_SITE_OTHER): Payer: Medicare Other | Admitting: Podiatry

## 2022-12-18 ENCOUNTER — Ambulatory Visit (INDEPENDENT_AMBULATORY_CARE_PROVIDER_SITE_OTHER): Payer: Medicare Other

## 2022-12-18 ENCOUNTER — Ambulatory Visit: Payer: Medicare Other | Admitting: Podiatry

## 2022-12-18 DIAGNOSIS — E559 Vitamin D deficiency, unspecified: Secondary | ICD-10-CM

## 2022-12-18 DIAGNOSIS — B351 Tinea unguium: Secondary | ICD-10-CM | POA: Diagnosis not present

## 2022-12-18 DIAGNOSIS — M79674 Pain in right toe(s): Secondary | ICD-10-CM | POA: Diagnosis not present

## 2022-12-18 DIAGNOSIS — S92301A Fracture of unspecified metatarsal bone(s), right foot, initial encounter for closed fracture: Secondary | ICD-10-CM

## 2022-12-18 DIAGNOSIS — S93401A Sprain of unspecified ligament of right ankle, initial encounter: Secondary | ICD-10-CM

## 2022-12-18 DIAGNOSIS — M79675 Pain in left toe(s): Secondary | ICD-10-CM | POA: Diagnosis not present

## 2022-12-18 NOTE — Patient Instructions (Addendum)
Look for urea [id number] with 2% salicylic acid cream or ointment and apply to the thickened dry skin / calluses. This can be bought over the counter, at a pharmacy or online such as [employer].   Look for an "EvenUp" shoe attachment on [employer] or at [employer]. This will level out your hips while you are walking in the CAM boot. Wear this on the other foot around a supportive sneaker:

## 2022-12-19 NOTE — Progress Notes (Signed)
  Subjective:  Patient ID: Jill Larson, female    DOB: 04-Nov-1940,  MRN: [id number]  Chief Complaint  Patient presents with   Nail Problem    Thick painful toenails, 3 month follow up - Left foot callus   Ankle Pain    right    82 y.o. female presents with the above complaint. History confirmed with patient.  She twisted her foot recently putting on slippers.  It has been painful and swollen since.  This happened recently.  She also notes thickened elongated painful toenails.  Objective:  Physical Exam: warm, good capillary refill, no trophic changes or ulcerative lesions, normal DP and PT pulses, and normal sensory exam. Left Foot: dystrophic yellowed discolored nail plates with subungual debris Right Foot: dystrophic yellowed discolored nail plates with subungual debris and pain edema and bruising across dorsal midfoot  No images are attached to the encounter.  Radiographs: Multiple views x-ray of right foot and ankle: Spiral fracture with some displacement of the fourth metatarsal shaft, metaphyseal fracture of fifth metatarsal with minimal displacement Assessment:   1. Closed fracture of shaft of metatarsal bone, right, initial encounter   2. Vitamin D deficiency disease   3. Pain due to onychomycosis of toenails of both feet      Plan:  Patient was evaluated and treated and all questions answered.   Discussed the etiology and treatment options for the condition in detail with the patient. Educated patient on the topical and oral treatment options for mycotic nails. Recommended debridement of the nails today. Sharp and mechanical debridement performed of all painful and mycotic nails today. Nails debrided in length and thickness using a nail nipper to level of comfort. Discussed treatment options including appropriate shoe gear. Follow up as needed for painful nails.  We reviewed her radiographs.  We discussed that she has fractures of both the fourth and fifth  metatarsal.  I recommend checking her vitamin D and calcium level as the low velocity mechanism of her injury I and concern for pathologic fracture.  Her fifth metatarsal fracture is minimal displacement, the spiral fracture of the fourth met does have some displacement, but it at her age and activity level I would recommend trialing nonoperative treatment initially.  CAM boot was dispensed.  I recommended she utilize an assistive device like a cane or walker to use while wearing the cam boot.  I will see her back in 1 month for follow-up of her metatarsal fracture with new radiographs to be taken at that time.  Return in about 3 months (around 03/18/2023) for painful thick fungal nails.

## 2023-01-14 ENCOUNTER — Ambulatory Visit: Payer: Medicare Other | Admitting: Podiatry

## 2023-01-28 ENCOUNTER — Ambulatory Visit: Payer: Medicare Other | Admitting: Podiatry

## 2023-03-18 ENCOUNTER — Ambulatory Visit: Payer: Medicare Other | Admitting: Podiatry

## 2023-03-19 ENCOUNTER — Encounter: Payer: Self-pay | Admitting: Podiatry

## 2023-03-19 ENCOUNTER — Ambulatory Visit (INDEPENDENT_AMBULATORY_CARE_PROVIDER_SITE_OTHER): Payer: Medicare Other | Admitting: Podiatry

## 2023-03-19 DIAGNOSIS — Q828 Other specified congenital malformations of skin: Secondary | ICD-10-CM | POA: Diagnosis not present

## 2023-03-19 DIAGNOSIS — B351 Tinea unguium: Secondary | ICD-10-CM

## 2023-03-19 DIAGNOSIS — M79674 Pain in right toe(s): Secondary | ICD-10-CM

## 2023-03-19 DIAGNOSIS — M79675 Pain in left toe(s): Secondary | ICD-10-CM

## 2023-03-19 NOTE — Progress Notes (Signed)
This patient presents to the office with chief complaint of long thick painful nails.  Patient says the nails are painful walking and wearing shoes.  This patient is unable to self treat.  This patient is unable to trim her nails since she is unable to reach her nails. She also relates having pain due to a forefoot callus. She presents to the office for preventative foot care services.  General Appearance  Alert, conversant and in no acute stress.  Vascular  Dorsalis pedis and posterior tibial  pulses are palpable  bilaterally.  Capillary return is within normal limits  bilaterally. Temperature is within normal limits  bilaterally.  Neurologic  Senn-Weinstein monofilament wire test within normal limits  bilaterally. Muscle power within normal limits bilaterally.  Nails Thick disfigured discolored nails with subungual debris  from hallux to fifth toes bilaterally. No evidence of bacterial infection or drainage bilaterally.  Orthopedic  No limitations of motion  feet .  No crepitus or effusions noted.  No bony pathology or digital deformities noted.  Skin  normotropic skin noted bilaterally.  No signs of infections or ulcers noted.   Porokeratosis sub 2 left.  Onychomycosis  Nails  B/L.  Pain in right toes  Pain in left toes  Debridement of nails both feet followed trimming the nails with dremel tool.  Debride porokeratosis left foot.  RTC 3 months.   Helane Gunther DPM

## 2023-04-09 ENCOUNTER — Emergency Department (HOSPITAL_COMMUNITY)
Admission: EM | Admit: 2023-04-09 | Discharge: 2023-04-09 | Disposition: A | Discharge status: Home / Self Care | Payer: Medicare Other | Attending: Emergency Medicine | Admitting: Emergency Medicine

## 2023-04-09 ENCOUNTER — Ambulatory Visit
Admission: EM | Admit: 2023-04-09 | Discharge: 2023-04-09 | Disposition: A | Discharge status: Home / Self Care | Payer: Medicare Other

## 2023-04-09 ENCOUNTER — Emergency Department (HOSPITAL_COMMUNITY): Payer: Medicare Other

## 2023-04-09 ENCOUNTER — Other Ambulatory Visit: Payer: Self-pay

## 2023-04-09 DIAGNOSIS — I700 Atherosclerosis of aorta: Secondary | ICD-10-CM | POA: Insufficient documentation

## 2023-04-09 DIAGNOSIS — S14109A Unspecified injury at unspecified level of cervical spinal cord, initial encounter: Secondary | ICD-10-CM | POA: Diagnosis present

## 2023-04-09 DIAGNOSIS — S12100A Unspecified displaced fracture of second cervical vertebra, initial encounter for closed fracture: Secondary | ICD-10-CM | POA: Diagnosis not present

## 2023-04-09 DIAGNOSIS — I6789 Other cerebrovascular disease: Secondary | ICD-10-CM | POA: Diagnosis not present

## 2023-04-09 DIAGNOSIS — S12200A Unspecified displaced fracture of third cervical vertebra, initial encounter for closed fracture: Secondary | ICD-10-CM

## 2023-04-09 DIAGNOSIS — S199XXA Unspecified injury of neck, initial encounter: Secondary | ICD-10-CM

## 2023-04-09 DIAGNOSIS — I2510 Atherosclerotic heart disease of native coronary artery without angina pectoris: Secondary | ICD-10-CM | POA: Diagnosis not present

## 2023-04-09 DIAGNOSIS — W19XXXA Unspecified fall, initial encounter: Secondary | ICD-10-CM | POA: Diagnosis not present

## 2023-04-09 DIAGNOSIS — S12300A Unspecified displaced fracture of fourth cervical vertebra, initial encounter for closed fracture: Secondary | ICD-10-CM | POA: Diagnosis not present

## 2023-04-09 DIAGNOSIS — S0990XA Unspecified injury of head, initial encounter: Secondary | ICD-10-CM | POA: Diagnosis not present

## 2023-04-09 MED ORDER — OXYCODONE-ACETAMINOPHEN 5-325 MG PO TABS
1.0000 | ORAL_TABLET | Freq: Once | ORAL | Status: AC
  Administered 2023-04-09: 1 via ORAL
  Filled 2023-04-09: qty 1

## 2023-04-09 MED ORDER — OXYCODONE-ACETAMINOPHEN 5-325 MG PO TABS: 1.0000 | ORAL_TABLET | Freq: Four times a day (QID) | ORAL | 0 refills | Status: DC | PRN

## 2023-04-09 NOTE — ED Notes (Signed)
Patient is being discharged from the Urgent Care and sent to the Emergency Department via POV. Per Loraine Grip. Patient is in need of higher level of care due to Fall that resulted in injury of head, neck, and back. Patient is aware and verbalizes understanding of plan of care.  Vitals:   04/09/23 1417  BP: (!) 153/85  Pulse: 62  Resp: 13  Temp: 98 F (36.7 C)  SpO2: 95%

## 2023-04-09 NOTE — Progress Notes (Signed)
Orthopedic Tech Progress Note Patient Details:  Jill Larson 11-Apr-1941 [id number]  Ortho Devices Type of Ortho Device: Aspen cervical collar Ortho Device/Splint Interventions: Ordered, Application, Adjustment   Post Interventions Patient Tolerated: Well Instructions Provided: Adjustment of device, Care of device  Kizzie Fantasia 04/09/2023, 6:04 PM

## 2023-04-09 NOTE — ED Provider Notes (Signed)
 [place] EMERGENCY DEPARTMENT AT [facility] Provider Note   CSN: [id number] Arrival date & time: 04/09/23  1533     History Chief Complaint  Patient presents with   Jill Larson is a 82 y.o. female.  Patient presents to the emergency department complaints of a fall.  Not currently on blood thinners.  Reports that she lost her footing when she was walking into a doorway striking her back against the door frame and then falling.  Endorses pain at the base of her skull, neck, thoracic spine, lumbar spine.  Endorses prior history of back surgeries but is unsure exactly which area was surgically managed.  Denies any nausea, vomiting, headache, confusion.  Able to ambulate without difficulty.   Fall       Home Medications Prior to Admission medications   Medication Sig Start Date End Date Taking? Authorizing Provider  ALPRAZolam Prudy Feeler) 1 MG tablet Take 1 mg by mouth 3 (three) times daily as needed.  12/15/16   [provider]  atorvastatin (LIPITOR) 10 MG tablet Take 10 mg by mouth daily. 03/10/23   [provider]  b complex vitamins capsule Take 1 capsule by mouth daily.    [provider]  Cholecalciferol (VITAMIN D PO) Take 1 capsule by mouth in the morning and at bedtime.     [provider]  doxycycline (VIBRA-TABS) 100 MG tablet Take 1 tablet (100 mg total) by mouth 2 (two) times daily. 03/14/22   Vivi Barrack, DPM  Omega-3 Fatty Acids (FISH OIL) 1000 MG CAPS Take 1 capsule by mouth daily.     [provider]  VRAYLAR capsule Take 1.5 mg by mouth daily. 09/01/20   [provider]      Allergies    Patient has no known allergies.    Review of Systems   Review of Systems  Musculoskeletal:  Positive for back pain.  All other systems reviewed and are negative.   Physical Exam Updated Vital Signs BP 136/60   Pulse 77   Temp 98.3 F (36.8 C) (Oral)   Resp 18   Ht 5\' 1"  (1.549 m)   Wt 51  kg   SpO2 98%   BMI 21.24 kg/m  Physical Exam Vitals and nursing note reviewed.  Constitutional:      General: She is not in acute distress.    Appearance: She is well-developed.  HENT:     Head: Normocephalic and atraumatic.  Eyes:     General: No visual field deficit.    Conjunctiva/sclera: Conjunctivae normal.  Cardiovascular:     Rate and Rhythm: Normal rate and regular rhythm.     Heart sounds: No murmur heard. Pulmonary:     Effort: Pulmonary effort is normal. No respiratory distress.     Breath sounds: Normal breath sounds.  Abdominal:     Palpations: Abdomen is soft.     Tenderness: There is no abdominal tenderness.  Musculoskeletal:        General: Tenderness and signs of injury present. No swelling or deformity. Normal range of motion.     Cervical back: Neck supple.     Comments: Pain to palpation along the base of the skull as well as cervical spine.  Range of motion in cervical spine, limited due to pain.  Some bony tenderness along the spinous processes of the thoracic and lumbar spine.  No obvious bony deformities.  No pain, ecchymosis, or deformity elsewhere.  Skin:  General: Skin is warm and dry.     Capillary Refill: Capillary refill takes less than 2 seconds.  Neurological:     Mental Status: She is alert.     Cranial Nerves: Cranial nerves 2-12 are intact. No cranial nerve deficit or facial asymmetry.     Comments: No appreciable neurological deficits when comparing right and left upper extremities.  No sense of weakness or numbness in either extremity.  Psychiatric:        Mood and Affect: Mood normal.     ED Results / Procedures / Treatments   Labs (all labs ordered are listed, but only abnormal results are displayed) Labs Reviewed - No data to display  EKG None  Radiology CT Thoracic Spine Wo Contrast  Result Date: 04/09/2023 CLINICAL DATA:  Trauma, fall EXAM: CT THORACIC SPINE WITHOUT CONTRAST TECHNIQUE: Multidetector CT images of the  thoracic were obtained using the standard protocol without intravenous contrast. RADIATION DOSE REDUCTION: This exam was performed according to the departmental dose-optimization program which includes automated exposure control, adjustment of the mA and/or kV according to patient size and/or use of iterative reconstruction technique. COMPARISON:  CT done on 12/15/2014 FINDINGS: Alignment: There is mild posterior bulging of body of T12 vertebra. Vertebrae: There is 30-40% decrease in height of body of T12 vertebra without demonstrable break in the cortical margins. There is a mild decrease in height of upper endplate of body of C7 vertebra. Paraspinal and other soft tissues: There is no central spinal stenosis. Disc levels: There is mild narrowing of neural foramina at T1-T2 level. IMPRESSION: There is 30-40% decrease in height of body of T12 vertebra with mild bulging of posterior margin of the body of T12 vertebra. Findings may suggest old compression fracture. Possibility of a recent fracture is not excluded. If there is focal tenderness at thoracolumbar junction, follow-up MRI may be considered. Mild decrease in height of body of C7 vertebra suggesting recent mild compression. There is no central spinal stenosis. There is mild encroachment of neural foramina at T1-T2 level. Electronically Signed   By: Ernie Avena M.D.   On: 04/09/2023 17:42   CT Chest Wo Contrast  Result Date: 04/09/2023 CLINICAL DATA:  Trauma, fall EXAM: CT CHEST WITHOUT CONTRAST TECHNIQUE: Multidetector CT imaging of the chest was performed following the standard protocol without IV contrast. RADIATION DOSE REDUCTION: This exam was performed according to the departmental dose-optimization program which includes automated exposure control, adjustment of the mA and/or kV according to patient size and/or use of iterative reconstruction technique. COMPARISON:  Chest radiographs done on 12/15/2019 FINDINGS: Cardiovascular:  Calcifications are seen in thoracic aorta and its major branches. Coronary artery calcifications are seen. Mediastinum/Nodes: There is no mediastinal hematoma. Lungs/Pleura: There is no focal pulmonary consolidation. Pleural thickening is seen in both apices suggesting scarring. There is no pleural effusion or pneumothorax. Upper Abdomen: No acute findings are seen. Musculoskeletal: There is 30-40% decrease in height of body of T12 vertebra without break in the cortical margins. There are pockets of air in the disc spaces from T10-L1 levels. IMPRESSION: There is 30-40% decrease in height of body of T12 vertebra, possibly old fracture. Possibility of recent compression in body of T12 is not excluded. If there are focal symptoms, follow-up MRI may be considered. There is no focal pulmonary consolidation. There is no pleural effusion or pneumothorax. There is no mediastinal hematoma. Aortic atherosclerosis.  Coronary artery calcifications are seen. Electronically Signed   By: Ernie Avena M.D.   On: 04/09/2023  17:37   CT Lumbar Spine Wo Contrast  Result Date: 04/09/2023 CLINICAL DATA:  Trauma, fall EXAM: CT LUMBAR SPINE WITHOUT CONTRAST TECHNIQUE: Multidetector CT imaging of the lumbar spine was performed without intravenous contrast administration. Multiplanar CT image reconstructions were also generated. RADIATION DOSE REDUCTION: This exam was performed according to the departmental dose-optimization program which includes automated exposure control, adjustment of the mA and/or kV according to patient size and/or use of iterative reconstruction technique. COMPARISON:  12/15/2014 FINDINGS: Segmentation: There are 5 non-rib-bearing vertebrae in lumbar spine. Alignment: Alignment of posterior margins of vertebral bodies is unremarkable. Vertebrae: There is posterior surgical fusion from L2-S1 levels. There is 30-40% decrease in height of body of T12 vertebra which was not seen in the previous CT. There is no  demonstrable fracture line. Schmorl's nodes are seen in the upper endplate and lower endplate of body T12 vertebra. Paraspinal and other soft tissues: Calcifications are seen in thoracic aorta and its major branches. Beam hardening artifacts caused by surgical hardware limit evaluation of spinal canal and neural foramina. Disc levels: Evaluation of neural foramina is limited by beam hardening artifacts. IMPRESSION: There is 30-40% decrease in height of upper endplate of body of T12 vertebra without definite break in the cortical margins. This may suggest old compression fracture. This finding was not seen in the previous CT done on 12/15/2014. If there are focal symptoms, follow-up MRI may be considered. There is surgical fusion from L2-S1 levels. Beam hardening artifacts caused by surgical hardware limit evaluation of spinal canal and neural foramina. Electronically Signed   By: Ernie Avena M.D.   On: 04/09/2023 17:29   CT Cervical Spine Wo Contrast  Result Date: 04/09/2023 CLINICAL DATA:  Trauma, fall EXAM: CT CERVICAL SPINE WITHOUT CONTRAST TECHNIQUE: Multidetector CT imaging of the cervical spine was performed without intravenous contrast. Multiplanar CT image reconstructions were also generated. RADIATION DOSE REDUCTION: This exam was performed according to the departmental dose-optimization program which includes automated exposure control, adjustment of the mA and/or kV according to patient size and/or use of iterative reconstruction technique. COMPARISON:  None Available. FINDINGS: Alignment: There is minimal retrolisthesis at C4-C5 level, possibly chronic. Skull base and vertebrae: There is mild compression in the upper endplate of body of C7 vertebra. There is break in the cortical margin in the anterior aspect of upper endplate of body of C7 vertebra. There are displaced fractures in the spinous processes of C2, C3 and C4 vertebrae. Soft tissues and spinal canal: Posterior bony spurs are  causing extrinsic pressure over the ventral margin of thecal sac at multiple levels, more so on the left side at C4-C5 level with spinal stenosis. Disc levels: There is encroachment of neural foramina by bony spurs from C4-C7 levels. Upper chest: Pleural densities are seen in the apices, more so on the right side. Other: Scattered arterial calcifications are seen. IMPRESSION: There is mild 10-20% compression in upper endplate of body of C7 vertebra with break in the cortical margins suggesting recent mild compression. Displaced fractures are seen in the spinous processes of C2, C3 and C4 vertebrae. Cervical spondylosis with encroachment of neural foramina from C4-C7 levels. Electronically Signed   By: Ernie Avena M.D.   On: 04/09/2023 17:17   CT Head Wo Contrast  Result Date: 04/09/2023 CLINICAL DATA:  Trauma, fall EXAM: CT HEAD WITHOUT CONTRAST TECHNIQUE: Contiguous axial images were obtained from the base of the skull through the vertex without intravenous contrast. RADIATION DOSE REDUCTION: This exam was performed according to the  departmental dose-optimization program which includes automated exposure control, adjustment of the mA and/or kV according to patient size and/or use of iterative reconstruction technique. COMPARISON:  08/02/2020 FINDINGS: Brain: No acute intracranial findings are seen. There are no signs of bleeding within the cranium. Ventricles are not dilated. There is decreased density in periventricular and subcortical white matter. Cortical sulci are prominent. Vascular: Unremarkable. Skull: No fracture is seen in calvarium. Sinuses/Orbits: Unremarkable. Other: None IMPRESSION: No acute intracranial findings are seen in noncontrast CT brain. Atrophy. Small-vessel disease. Electronically Signed   By: Ernie Avena M.D.   On: 04/09/2023 17:06    Procedures Procedures   Medications Ordered in ED Medications  oxyCODONE-acetaminophen (PERCOCET/ROXICET) 5-325 MG per tablet 1  tablet (1 tablet Oral Given 04/09/23 1756)    ED Course/ Medical Decision Making/ A&P                           Medical Decision Making Amount and/or Complexity of Data Reviewed Radiology: ordered.  Risk Prescription drug management.   This patient presents to the ED for concern of fall.  Differential diagnosis includes syncope, stroke, SAH, medication reaction, ACS   Imaging Studies ordered:  I ordered imaging studies including CT head, CT cervical spine, CT thoracic, CT chest, CT lumbar I independently visualized and interpreted imaging which showed close displaced fractures of the C2-C4 spinous processes.  Possible compression deformity at the T12 level. I agree with the radiologist interpretation   Medicines ordered and prescription drug management:  I ordered medication including Percocet for pain Reevaluation of the patient after these medicines showed that the patient improved I have reviewed the patients home medicines and have made adjustments as needed   Problem List / ED Course:  Patient presents to the emergency department complaints of mechanical fall.  Denies loss of consciousness and not currently on blood thinners.  Endorses pain in the head, neck, entire back.  Was seen at urgent care earlier today advised to come to the emergency department for further evaluation.  Will evaluate with trauma scans of CT cervical, head, chest, thoracic, lumbar. CT imaging with concerning findings of displaced fractures of the spinous processes between C2-C4.  Also notable compression deformity at T12.  Given area of fractures and proximity to the neural foramina, consult with neurosurgery for further recommendations and placed patient in cervical collar. Patient placed in cervical collar.  Will consult neurosurgery. With Dr. Franky Macho, neurosurgery, who reports that patient should be able to follow-up outpatient for these and placed in a cervical collar for use during the day  particularly if driving a vehicle.  Does not feel that patient requires use of cervical collar at nighttime while sleeping.  Likely fractures are nonoperative in nature will have patient follow-up in office in 1 week.  Informed patient of recommendations. Will plan on discharging patient with C-collar in place and pain medication sent to pharmacy. Advised patient that she should follow up with neurosurgery in the next week or so. Patient is agreeable with treatment plan and verbalized understanding all return precautions. All questions answered prior to patient discharge.  Final Clinical Impression(s) / ED Diagnoses Final diagnoses:  Closed displaced fracture of second cervical vertebra, unspecified fracture morphology, initial encounter (HCC)  Closed displaced fracture of third cervical vertebra, unspecified fracture morphology, initial encounter (HCC)  Closed displaced fracture of fourth cervical vertebra, unspecified fracture morphology, initial encounter (HCC)    Rx / DC Orders ED Discharge Orders  Ordered    Ambulatory referral to Neurosurgery        04/09/23 1833              Salomon Mast 04/09/23 1851    Mardene Sayer, MD 04/10/23 1131

## 2023-04-09 NOTE — ED Notes (Signed)
Ortho tech applied c-collar

## 2023-04-09 NOTE — Discharge Instructions (Signed)
Please seek care in the Emergency Department. I suspect you may need scans/imaging of your head and neck that I can't do here in Urgent Care

## 2023-04-09 NOTE — ED Provider Notes (Signed)
[facility]    CSN: [id number] Arrival date & time: 04/09/23  1329      History   Chief Complaint No chief complaint on file.   HPI Jill Larson is a 82 y.o. female.  She is here with a family member. Pt complains of neck and back pain after a fall, pt fell late 04/06/2023 at night. Pt's daughter states she has pain in her from her head to her neck.  Patient does not really remember what happened or how she fell.  She was getting something out of the pantry and fell backwards landing on the molding at chair height, then fell to the ground. Pt is unable to lift her head up since the fall.  Denies headache.  Just has soreness on her head where she hit her head.  HPI  Past Medical History:  Diagnosis Date   Accidental fall 08/31/2020   Acute encephalopathy 12/13/2019   Altered mental status 12/13/2019   Anxiety    ANXIETY 04/18/2008   Qualifier: Diagnosis of  By: Renaldo Fiddler CMA, Rolley Sims PAIN, LUMBAR 04/18/2008   Qualifier: Diagnosis of  By: Renaldo Fiddler CMA, Leigh     Benign fundic gland polyps of stomach 2001   Bradycardia 08/31/2020   Chronic LBP 03/11/2015   Clinical depression 03/11/2015   CONSTIPATION 04/18/2008   Qualifier: Diagnosis of  By: Renaldo Fiddler CMA, Marliss Czar     DDD (degenerative disc disease)    Depression    Depression    Fibromyalgia    FIBROMYALGIA 04/18/2008   Qualifier: Diagnosis of  By: Renaldo Fiddler CMA, Marliss Czar     GASTRIC POLYP 06/19/2008   Qualifier: History of  By: Kriste Basque MD, Lonzo Cloud    HYPERCHOLESTEROLEMIA 04/18/2008   Qualifier: Diagnosis of  By: Renaldo Fiddler CMA, Leigh     Hyperlipidemia    Hypertension    HYPERTENSION, BORDERLINE 04/18/2008   Qualifier: Diagnosis of  By: Renaldo Fiddler CMA, Leigh     Intestinal obstruction due to decreased peristalsis (HCC) 03/11/2015   Mitral valve prolapse    MITRAL VALVE PROLAPSE, HX OF 04/18/2008   Qualifier: Diagnosis of  By: Renaldo Fiddler CMA, Leigh     Osteoporosis    OSTEOPOROSIS 04/18/2008   Qualifier: Diagnosis of  By: Renaldo Fiddler CMA,  Leigh      Palpitations 06/14/2008   Qualifier: Diagnosis of  By: Kriste Basque MD, Lonzo Cloud    Pulmonary embolism (HCC) 03/11/2015   Scoliosis    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   SPL (spondylolisthesis) 03/11/2015    Patient Active Problem List   Diagnosis Date Noted   Bradycardia 08/31/2020   Accidental fall 08/31/2020   Scoliosis    Osteoporosis    Mitral valve prolapse    Hypertension    Hyperlipidemia    Fibromyalgia    Depression    Anxiety    Altered mental status 12/13/2019   Acute encephalopathy 12/13/2019   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   Chronic LBP 03/11/2015   Clinical depression 03/11/2015   Intestinal obstruction due to decreased peristalsis (HCC) 03/11/2015   Pulmonary embolism (HCC) 03/11/2015   SPL (spondylolisthesis) 03/11/2015   OTHER DISEASES OF LUNG NOT ELSEWHERE CLASSIFIED 10/30/2009   GASTRIC POLYP 06/19/2008   PALPITATIONS 06/14/2008   HYPERCHOLESTEROLEMIA 04/18/2008   ANXIETY 04/18/2008   HYPERTENSION, BORDERLINE 04/18/2008   CONSTIPATION 04/18/2008   BACK PAIN, LUMBAR 04/18/2008   FIBROMYALGIA 04/18/2008   OSTEOPOROSIS 04/18/2008   MITRAL VALVE PROLAPSE, HX OF 04/18/2008   Benign fundic gland polyps of stomach 2001  Past Surgical History:  Procedure Laterality Date   BACK SURGERY     BREAST EXCISIONAL BIOPSY Right pt unsure   BREAST SURGERY     right to remove calcium deposits   TONSILLECTOMY AND ADENOIDECTOMY     TUBAL LIGATION      OB History   No obstetric history on file.      Home Medications    Prior to Admission medications   Medication Sig Start Date End Date Taking? Authorizing Provider  atorvastatin (LIPITOR) 10 MG tablet Take 10 mg by mouth daily. 03/10/23  Yes [provider]  ALPRAZolam Prudy Feeler) 1 MG tablet Take 1 mg by mouth 3 (three) times daily as needed.  12/15/16   [provider]  b complex vitamins capsule Take 1 capsule by mouth daily.    [provider]  Cholecalciferol (VITAMIN D PO) Take 1  capsule by mouth in the morning and at bedtime.     [provider]  doxycycline (VIBRA-TABS) 100 MG tablet Take 1 tablet (100 mg total) by mouth 2 (two) times daily. 03/14/22   Vivi Barrack, DPM  Omega-3 Fatty Acids (FISH OIL) 1000 MG CAPS Take 1 capsule by mouth daily.     [provider]  VRAYLAR capsule Take 1.5 mg by mouth daily. 09/01/20   [provider]    Family History Family History  Problem Relation Age of Onset   Hypertension Father    Pancreatic cancer Sister    Colon cancer Neg Hx     Social History Social History   Tobacco Use   Smoking status: Former    Types: Cigarettes   Smokeless tobacco: Never  Substance Use Topics   Alcohol use: Yes    Comment: Social   Drug use: No     Allergies   Patient has no known allergies.   Review of Systems Review of Systems   Physical Exam Triage Vital Signs ED Triage Vitals  Enc Vitals Group     BP 04/09/23 1417 (!) 153/85     Pulse Rate 04/09/23 1417 62     Resp 04/09/23 1417 13     Temp 04/09/23 1417 98 F (36.7 C)     Temp Source 04/09/23 1417 Oral     SpO2 04/09/23 1417 95 %     Weight --      Height --      Head Circumference --      Peak Flow --      Pain Score 04/09/23 1422 10     Pain Loc --      Pain Edu? --      Excl. in GC? --    No data found.  Updated Vital Signs BP (!) 153/85 (BP Location: Left Arm)   Pulse 62   Temp 98 F (36.7 C) (Oral)   Resp 13   SpO2 95%   Visual Acuity Right Eye Distance:   Left Eye Distance:   Bilateral Distance:    Right Eye Near:   Left Eye Near:    Bilateral Near:     Physical Exam Constitutional:      General: She is not in acute distress. HENT:     Head: Normocephalic. Contusion present.     Comments: Contusion posterior scalp Pulmonary:     Effort: Pulmonary effort is normal.  Musculoskeletal:     Cervical back: Tenderness and bony tenderness present.  Neurological:     Mental Status: She is alert.  UC Treatments / Results  Labs (all labs ordered are listed, but only abnormal results are displayed) Labs Reviewed - No data to display  EKG   Radiology No results found.  Procedures Procedures (including critical care time)  Medications Ordered in UC Medications - No data to display  Initial Impression / Assessment and Plan / UC Course  I have reviewed the triage vital signs and the nursing notes.  Pertinent labs & imaging results that were available during my care of the patient were reviewed by me and considered in my medical decision making (see chart for details).    Explained to patient and family member I do not have the capability to do the imaging here and that the patient needs and directed her to the ER.  Final Clinical Impressions(s) / UC Diagnoses   Final diagnoses:  Injury of head, initial encounter  Injury of neck, initial encounter     Discharge Instructions      Please seek care in the Emergency Department. I suspect you may need scans/imaging of your head and neck that I can't do here in Urgent Care   ED Prescriptions   None    PDMP not reviewed this encounter.   Cathlyn Parsons, NP 04/09/23 1441

## 2023-04-09 NOTE — ED Triage Notes (Signed)
Pt reports mechanical fall, denies loc or blood thinner use. C/o head, neck and back pain.

## 2023-04-09 NOTE — ED Triage Notes (Signed)
Pt c/I neck and back pain after a fall, pt fell late Sunday night. Pt's daughter states she has pain in her from her neck to her lower back is bothering her and she has a few knots in her head, she was getting something out of the pantry and fell backwards landing on the molding. Pt is unable to lift her head up since the fall.

## 2023-04-09 NOTE — Discharge Instructions (Addendum)
You were seen in the emergency department for a fall. Unfortunately there were fractures noted along the C2-C4 levels in the neck, likely explaining some of the pain you are getting in your neck. I have sent some pain medication to your pharmacy to take for more severe pain. If symptoms are worsening, return to the ER.

## 2023-04-22 ENCOUNTER — Ambulatory Visit (INDEPENDENT_AMBULATORY_CARE_PROVIDER_SITE_OTHER): Payer: Medicare Other | Admitting: Podiatry

## 2023-04-22 DIAGNOSIS — Z91199 Patient's noncompliance with other medical treatment and regimen due to unspecified reason: Secondary | ICD-10-CM

## 2023-04-22 NOTE — Progress Notes (Signed)
1. No-show for appointment   Recent ED visit. 

## 2023-04-28 ENCOUNTER — Other Ambulatory Visit: Payer: Self-pay | Admitting: Student in an Organized Health Care Education/Training Program

## 2023-04-28 DIAGNOSIS — S129XXA Fracture of neck, unspecified, initial encounter: Secondary | ICD-10-CM

## 2023-04-29 DIAGNOSIS — F411 Generalized anxiety disorder: Secondary | ICD-10-CM | POA: Insufficient documentation

## 2023-04-29 DIAGNOSIS — R296 Repeated falls: Secondary | ICD-10-CM | POA: Insufficient documentation

## 2023-05-02 ENCOUNTER — Ambulatory Visit
Admission: RE | Admit: 2023-05-02 | Discharge: 2023-05-02 | Disposition: A | Discharge status: Home / Self Care | Payer: Medicare Other | Source: Ambulatory Visit | Attending: Student in an Organized Health Care Education/Training Program | Admitting: Student in an Organized Health Care Education/Training Program

## 2023-05-02 DIAGNOSIS — S129XXA Fracture of neck, unspecified, initial encounter: Secondary | ICD-10-CM

## 2023-05-15 ENCOUNTER — Other Ambulatory Visit: Payer: Medicare Other

## 2023-05-26 ENCOUNTER — Emergency Department (HOSPITAL_COMMUNITY): Payer: Medicare Other

## 2023-05-26 ENCOUNTER — Encounter (HOSPITAL_COMMUNITY): Payer: Self-pay | Admitting: Emergency Medicine

## 2023-05-26 ENCOUNTER — Emergency Department (HOSPITAL_COMMUNITY)
Admission: EM | Admit: 2023-05-26 | Discharge: 2023-05-26 | Disposition: A | Discharge status: Home / Self Care | Payer: Medicare Other | Attending: Emergency Medicine | Admitting: Emergency Medicine

## 2023-05-26 ENCOUNTER — Other Ambulatory Visit: Payer: Self-pay

## 2023-05-26 DIAGNOSIS — S0990XA Unspecified injury of head, initial encounter: Secondary | ICD-10-CM | POA: Insufficient documentation

## 2023-05-26 DIAGNOSIS — W19XXXA Unspecified fall, initial encounter: Secondary | ICD-10-CM

## 2023-05-26 DIAGNOSIS — W01198A Fall on same level from slipping, tripping and stumbling with subsequent striking against other object, initial encounter: Secondary | ICD-10-CM | POA: Diagnosis not present

## 2023-05-26 DIAGNOSIS — M545 Low back pain, unspecified: Secondary | ICD-10-CM | POA: Insufficient documentation

## 2023-05-26 DIAGNOSIS — M542 Cervicalgia: Secondary | ICD-10-CM | POA: Insufficient documentation

## 2023-05-26 DIAGNOSIS — Z87891 Personal history of nicotine dependence: Secondary | ICD-10-CM | POA: Diagnosis not present

## 2023-05-26 LAB — CBC WITH DIFFERENTIAL/PLATELET
Abs Immature Granulocytes: 0.03 10*3/uL (ref 0.00–0.07)
Basophils Absolute: 0 10*3/uL (ref 0.0–0.1)
Basophils Relative: 1 %
Eosinophils Absolute: 0 10*3/uL (ref 0.0–0.5)
Eosinophils Relative: 0 %
HCT: 34.6 % — ABNORMAL LOW (ref 36.0–46.0)
Hemoglobin: 11.8 g/dL — ABNORMAL LOW (ref 12.0–15.0)
Immature Granulocytes: 1 %
Lymphocytes Relative: 5 %
Lymphs Abs: 0.3 10*3/uL — ABNORMAL LOW (ref 0.7–4.0)
MCH: 29.9 pg (ref 26.0–34.0)
MCHC: 34.1 g/dL (ref 30.0–36.0)
MCV: 87.8 fL (ref 80.0–100.0)
Monocytes Absolute: 0.5 10*3/uL (ref 0.1–1.0)
Monocytes Relative: 8 %
Neutro Abs: 5.3 10*3/uL (ref 1.7–7.7)
Neutrophils Relative %: 85 %
Platelets: 154 10*3/uL (ref 150–400)
RBC: 3.94 MIL/uL (ref 3.87–5.11)
RDW: 12.5 % (ref 11.5–15.5)
WBC: 6.2 10*3/uL (ref 4.0–10.5)
nRBC: 0 % (ref 0.0–0.2)

## 2023-05-26 LAB — COMPREHENSIVE METABOLIC PANEL
ALT: 16 U/L (ref 0–44)
AST: 26 U/L (ref 15–41)
Albumin: 3.8 g/dL (ref 3.5–5.0)
Alkaline Phosphatase: 71 U/L (ref 38–126)
Anion gap: 11 (ref 5–15)
BUN: 15 mg/dL (ref 8–23)
CO2: 21 mmol/L — ABNORMAL LOW (ref 22–32)
Calcium: 8.6 mg/dL — ABNORMAL LOW (ref 8.9–10.3)
Chloride: 103 mmol/L (ref 98–111)
Creatinine, Ser: 1.09 mg/dL — ABNORMAL HIGH (ref 0.44–1.00)
GFR, Estimated: 51 mL/min — ABNORMAL LOW (ref >60)
Glucose, Bld: 118 mg/dL — ABNORMAL HIGH (ref 70–99)
Potassium: 3.4 mmol/L — ABNORMAL LOW (ref 3.5–5.1)
Sodium: 135 mmol/L (ref 135–145)
Total Bilirubin: 0.9 mg/dL (ref 0.3–1.2)
Total Protein: 6.8 g/dL (ref 6.5–8.1)

## 2023-05-26 MED ORDER — KETOROLAC TROMETHAMINE 15 MG/ML IJ SOLN
15.0000 mg | Freq: Once | INTRAMUSCULAR | Status: AC
  Administered 2023-05-26: 15 mg via INTRAVENOUS

## 2023-05-26 MED ORDER — FENTANYL CITRATE PF 50 MCG/ML IJ SOSY
50.0000 ug | PREFILLED_SYRINGE | Freq: Once | INTRAMUSCULAR | Status: AC
  Administered 2023-05-26: 50 ug via INTRAVENOUS
  Filled 2023-05-26: qty 1

## 2023-05-26 MED ORDER — LORAZEPAM 1 MG PO TABS
0.5000 mg | ORAL_TABLET | Freq: Once | ORAL | Status: AC
  Administered 2023-05-26: 0.5 mg via ORAL
  Filled 2023-05-26: qty 1

## 2023-05-26 MED ORDER — KETOROLAC TROMETHAMINE 15 MG/ML IJ SOLN
15.0000 mg | Freq: Once | INTRAMUSCULAR | Status: DC
  Filled 2023-05-26: qty 1

## 2023-05-26 MED ORDER — ONDANSETRON 4 MG PO TBDP
4.0000 mg | ORAL_TABLET | Freq: Once | ORAL | Status: AC
  Administered 2023-05-26: 4 mg via ORAL
  Filled 2023-05-26: qty 1

## 2023-05-26 NOTE — ED Notes (Signed)
Attempted to call pt's daughter for updated ETA to pick pt up with no answer.

## 2023-05-26 NOTE — ED Provider Notes (Signed)
  Physical Exam  BP 120/60   Pulse 86   Temp 98.6 F (37 C) (Oral)   Resp 16   Ht 5\' 1"  (1.549 m)   Wt 45.4 kg   SpO2 98%   BMI 18.89 kg/m   Physical Exam Vitals and nursing note reviewed.  Constitutional:      General: She is not in acute distress.    Appearance: She is well-developed.  HENT:     Head: Normocephalic and atraumatic.  Eyes:     Conjunctiva/sclera: Conjunctivae normal.  Cardiovascular:     Rate and Rhythm: Normal rate and regular rhythm.     Heart sounds: No murmur heard. Pulmonary:     Effort: Pulmonary effort is normal. No respiratory distress.     Breath sounds: Normal breath sounds.  Abdominal:     Palpations: Abdomen is soft.     Tenderness: There is no abdominal tenderness.  Musculoskeletal:        General: No swelling.     Cervical back: Neck supple.  Skin:    General: Skin is warm and dry.     Capillary Refill: Capillary refill takes less than 2 seconds.  Neurological:     Mental Status: She is alert.  Psychiatric:        Mood and Affect: Mood normal.     Procedures  Procedures  ED Course / MDM    Medical Decision Making Amount and/or Complexity of Data Reviewed Labs: ordered. Radiology: ordered.  Risk Prescription drug management.   Patient received in handoff.  Fall pending CT scans trauma imaging reassuringly negative including a CT C-spine showing healed previous spinal fractures.  Patient requesting to remove c-collar and we did perform a c-collar removal trial in the emergency department of which she had full range of motion of the neck with no significant pain.  She was instructed to use this collar as needed but would be okay for intermittent periods without needing to wear this given normal CT imaging at this time.  Laboratory evaluation unremarkable and patient will discharge with outpatient follow-up.  She currently does not meet inpatient criteria for admission given normal trauma workup.       Glendora Score,  MD 05/26/23 [phone]

## 2023-05-26 NOTE — ED Notes (Signed)
Patient to CT.

## 2023-05-26 NOTE — ED Notes (Signed)
Concerned about pt mental status regarding DC to waiting room.  DC delayed pending family arrival.

## 2023-05-26 NOTE — ED Triage Notes (Signed)
Patient BIB [facility] from home c/o mechanical fall after tripping while getting up to go to the restroom.  Patient reports she hit her head.  Patient endorses lower back pain, neck pain and head pain.  Patient became nauseous while enroute to hospital.   160/92 96 HR 98% RA

## 2023-05-26 NOTE — ED Notes (Signed)
Attempted to call daughter regarding transport for MOm (patient). No answer, voicemail full.

## 2023-05-26 NOTE — ED Notes (Signed)
Patient to xray.

## 2023-05-26 NOTE — ED Provider Notes (Signed)
[facility] Emergency Department Provider Note MRN:  [id number]  Arrival date & time: 05/26/23     Chief Complaint   Fall   History of Present Illness   Jill Larson is a 82 y.o. year-old female with a history of pulmonary embolism presenting to the ED with chief complaint of fall.  Patient was trying to find the doorknob with the light off and could not find it and then was backing up and tripped and fell.  Her tailbone and lower back hurts.  She hit her head, she is nauseated.  Mild neck pain.  No chest pain or shortness of breath, no abdominal pain.  Review of Systems  A thorough review of systems was obtained and all systems are negative except as noted in the HPI and PMH.   Patient's Health History    Past Medical History:  Diagnosis Date   Accidental fall 08/31/2020   Acute encephalopathy 12/13/2019   Altered mental status 12/13/2019   Anxiety    ANXIETY 04/18/2008   Qualifier: Diagnosis of  By: Renaldo Fiddler CMA, Rolley Sims PAIN, LUMBAR 04/18/2008   Qualifier: Diagnosis of  By: Renaldo Fiddler CMA, Leigh     Benign fundic gland polyps of stomach 2001   Bradycardia 08/31/2020   Chronic LBP 03/11/2015   Clinical depression 03/11/2015   CONSTIPATION 04/18/2008   Qualifier: Diagnosis of  By: Renaldo Fiddler CMA, Marliss Czar     DDD (degenerative disc disease)    Depression    Depression    Fibromyalgia    FIBROMYALGIA 04/18/2008   Qualifier: Diagnosis of  By: Renaldo Fiddler CMA, Leigh     GASTRIC POLYP 06/19/2008   Qualifier: History of  By: Kriste Basque MD, Lonzo Cloud    HYPERCHOLESTEROLEMIA 04/18/2008   Qualifier: Diagnosis of  By: Renaldo Fiddler CMA, Leigh     Hyperlipidemia    Hypertension    HYPERTENSION, BORDERLINE 04/18/2008   Qualifier: Diagnosis of  By: Renaldo Fiddler CMA, Leigh     Intestinal obstruction due to decreased peristalsis (HCC) 03/11/2015   Mitral valve prolapse    MITRAL VALVE PROLAPSE, HX OF 04/18/2008   Qualifier: Diagnosis of  By: Renaldo Fiddler CMA, Leigh     Osteoporosis     OSTEOPOROSIS 04/18/2008   Qualifier: Diagnosis of  By: Renaldo Fiddler CMA, Leigh      Palpitations 06/14/2008   Qualifier: Diagnosis of  By: Kriste Basque MD, Lonzo Cloud    Pulmonary embolism (HCC) 03/11/2015   Scoliosis    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   SPL (spondylolisthesis) 03/11/2015    Past Surgical History:  Procedure Laterality Date   BACK SURGERY     BREAST EXCISIONAL BIOPSY Right pt unsure   BREAST SURGERY     right to remove calcium deposits   TONSILLECTOMY AND ADENOIDECTOMY     TUBAL LIGATION      Family History  Problem Relation Age of Onset   Hypertension Father    Pancreatic cancer Sister    Colon cancer Neg Hx     Social History   Socioeconomic History   Marital status: Widowed    Spouse name: Not on file   Number of children: 2   Years of education: Not on file   Highest education level: Not on file  Occupational History   Occupation: Retired Comptroller  Tobacco Use   Smoking status: Former    Types: Cigarettes   Smokeless tobacco: Never  Substance and Sexual Activity   Alcohol use: Yes    Comment: Social   Drug  use: No   Sexual activity: Not on file  Other Topics Concern   Not on file  Social History Narrative   Not on file   Social Determinants of Health   Financial Resource Strain: Not on file  Food Insecurity: Not on file  Transportation Needs: Not on file  Physical Activity: Not on file  Stress: Not on file  Social Connections: Not on file  Intimate Partner Violence: Not on file     Physical Exam   Vitals:   05/26/23 0608 05/26/23 0610  BP: 126/60 126/80  Pulse: 90 89  Resp: (!) 21 20  Temp:  98.4 F (36.9 C)  SpO2: 100% 100%    CONSTITUTIONAL: Well-appearing, NAD NEURO/PSYCH:  Alert and oriented x 3, no focal deficits EYES:  eyes equal and reactive ENT/NECK:  no LAD, no JVD CARDIO: Regular rate, well-perfused, normal S1 and S2 PULM:  CTAB no wheezing or rhonchi GI/GU:  non-distended, non-tender MSK/SPINE:  No gross deformities, no  edema SKIN:  no rash, atraumatic   *Additional and/or pertinent findings included in MDM below  Diagnostic and Interventional Summary    EKG Interpretation Date/Time:  Monday May 26 2023 06:11:03 EDT Ventricular Rate:  91 PR Interval:  161 QRS Duration:  82 QT Interval:  377 QTC Calculation: 464 R Axis:   62  Text Interpretation: Sinus rhythm Borderline abnrm T, anterolateral leads Confirmed by Kennis Carina [phone]) on 05/26/2023 6:38:47 AM       Labs Reviewed  COMPREHENSIVE METABOLIC PANEL  CBC WITH DIFFERENTIAL/PLATELET    CT HEAD WO CONTRAST ( )  Final Result    CT CERVICAL SPINE WO CONTRAST  Final Result    DG Lumbar Spine Complete    (Results Pending)  DG Chest 1 View    (Results Pending)    Medications  ondansetron (ZOFRAN-ODT) disintegrating tablet 4 mg (4 mg Oral Given 05/26/23 1914)     Procedures  /  Critical Care Procedures  ED Course and Medical Decision Making  Initial Impression and Ddx Mechanical fall, head trauma with nausea, considering intracranial bleeding.  Fell onto her buttocks/lower back, considering spinal fracture.  Past medical/surgical history that increases complexity of ED encounter: PE but patient denies anticoagulant use at this time.  Interpretation of Diagnostics I personally reviewed the EKG and my interpretation is as follows: Sinus rhythm  Imaging pending  Patient Reassessment and Ultimate Disposition/Management     Signed out to oncoming provider at shift change.  Patient management required discussion with the following services or consulting groups:  None  Complexity of Problems Addressed Acute illness or injury that poses threat of life of bodily function  Additional Data Reviewed and Analyzed Further history obtained from: EMS on arrival  Additional Factors Impacting ED Encounter Risk Consideration of hospitalization  Elmer Sow. Pilar Plate, MD [facility] Emergency Medicine [facility]  Health [email or web address]  Final Clinical Impressions(s) / ED Diagnoses     ICD-10-CM   1. Fall, initial encounter  [email or web address]       ED Discharge Orders     None        Discharge Instructions Discussed with and Provided to Patient:   Discharge Instructions   None      Sabas Sous, MD 05/26/23 [phone]

## 2023-05-28 ENCOUNTER — Ambulatory Visit: Payer: Medicare Other | Admitting: Podiatry

## 2023-06-13 ENCOUNTER — Ambulatory Visit: Payer: Medicare Other | Admitting: Podiatry

## 2023-06-18 ENCOUNTER — Encounter: Payer: Self-pay | Admitting: Podiatry

## 2023-06-18 ENCOUNTER — Ambulatory Visit: Payer: Medicare Other | Admitting: Podiatry

## 2023-06-18 DIAGNOSIS — B351 Tinea unguium: Secondary | ICD-10-CM

## 2023-06-18 DIAGNOSIS — S9031XA Contusion of right foot, initial encounter: Secondary | ICD-10-CM | POA: Diagnosis not present

## 2023-06-18 DIAGNOSIS — M79675 Pain in left toe(s): Secondary | ICD-10-CM | POA: Diagnosis not present

## 2023-06-18 DIAGNOSIS — M79674 Pain in right toe(s): Secondary | ICD-10-CM

## 2023-06-18 NOTE — Progress Notes (Signed)
Subjective:   Patient ID: Jill Larson, female   DOB: 82 y.o.   MRN: [id number]   HPI Patient presents with caregiver with traumatized right big toe secondary to injury and also elongated nailbeds 1-5 both feet that are thick and can become painful.  States that the area has been red and irritated   ROS      Objective:  Physical Exam  Neurovascular status intact with the patient found to have a red inflamed right hallux nail bed medial side that is sore with depressed and makes wearing shoe gear difficult.  Also noted to have elongated nailbeds 1-5 both feet thick yellow brittle     Assessment:  Contusion of the right big toe with pain and drainage on the medial side localized with mycotic nail infection 1-5 both feet     Plan:  H&P reviewed both conditions separately.  For the contusion we are going to begin soaks in Epsom salts warm water Riki Rusk and usage with Band-Aid and open toed shoes.  May have to be opened up if symptoms get worse or any issues were to occur and today I debrided nailbeds to do 5 right 1 through 5 left with no iatrogenic bleeding

## 2023-06-19 ENCOUNTER — Ambulatory Visit: Payer: Medicare Other | Admitting: Podiatry

## 2023-07-29 DIAGNOSIS — G4700 Insomnia, unspecified: Secondary | ICD-10-CM | POA: Insufficient documentation

## 2023-07-29 DIAGNOSIS — R5383 Other fatigue: Secondary | ICD-10-CM | POA: Insufficient documentation

## 2023-09-23 ENCOUNTER — Ambulatory Visit (INDEPENDENT_AMBULATORY_CARE_PROVIDER_SITE_OTHER): Payer: Medicare Other | Admitting: Podiatry

## 2023-09-23 DIAGNOSIS — Z91199 Patient's noncompliance with other medical treatment and regimen due to unspecified reason: Secondary | ICD-10-CM

## 2023-09-24 NOTE — Progress Notes (Signed)
Patient was no-show for appointment today 

## 2023-10-24 DIAGNOSIS — E539 Vitamin B deficiency, unspecified: Secondary | ICD-10-CM | POA: Insufficient documentation

## 2024-01-28 ENCOUNTER — Ambulatory Visit: Admitting: Podiatrist

## 2024-02-04 ENCOUNTER — Encounter: Admitting: Obstetrics and Gynecology

## 2024-02-10 ENCOUNTER — Ambulatory Visit: Admitting: Podiatry

## 2024-02-13 ENCOUNTER — Ambulatory Visit: Admitting: Podiatry

## 2024-02-16 ENCOUNTER — Encounter: Payer: Self-pay | Admitting: Podiatry

## 2024-02-16 ENCOUNTER — Ambulatory Visit: Admitting: Podiatry

## 2024-02-16 DIAGNOSIS — M79675 Pain in left toe(s): Secondary | ICD-10-CM

## 2024-02-16 DIAGNOSIS — L84 Corns and callosities: Secondary | ICD-10-CM | POA: Diagnosis not present

## 2024-02-16 DIAGNOSIS — L909 Atrophic disorder of skin, unspecified: Secondary | ICD-10-CM

## 2024-02-16 DIAGNOSIS — B351 Tinea unguium: Secondary | ICD-10-CM | POA: Diagnosis not present

## 2024-02-16 DIAGNOSIS — M79674 Pain in right toe(s): Secondary | ICD-10-CM

## 2024-02-16 NOTE — Patient Instructions (Addendum)
 More silicone and felt pads can be purchased from:  [email or web address]  Look for metatarsal pads or horseshoe pads. You may also findings on [employer] or at some drugstores.  Look for urea 40% cream or ointment and apply to the thickened dry skin / calluses. This can be bought over the counter, at a pharmacy or online such as [employer].

## 2024-02-16 NOTE — Progress Notes (Unsigned)
  Subjective:  Patient ID: Jill Larson, female    DOB: [birth year],  MRN: [id number]  Chief Complaint  Patient presents with   RFC    RFC   Callouses    Left bottom of foot , no blood thinner    83 y.o. female presents with the above complaint. History confirmed with patient. Patient presenting with pain related to dystrophic thickened elongated nails. Patient is unable to trim own nails related to nail dystrophy and/or mobility issues. Patient does not have a history of T2DM. Patient does/does not have callus present located at the left forefoot causing pain.   Objective:  Physical Exam: warm, good capillary refill nail exam onychomycosis of the toenails, onycholysis, and dystrophic nails DP pulses palpable, PT pulses palpable, and protective sensation intact Left Foot:  Pain with palpation of nails due to elongation and dystrophic growth.  Painful nucleated hyperkeratotic callus present left subthird metarsal head Right Foot: Pain with palpation of nails due to elongation and dystrophic growth.  Fat pad atrophy present to bilateral forefoot Assessment:   1. Pain due to onychomycosis of toenails of both feet   2. Fat pad atrophy of foot   3. Pre-ulcerative calluses      Plan:  Patient was evaluated and treated and all questions answered.  #Hyperkeratotic lesions/pre ulcerative calluses present left subthird metatarsal head All symptomatic hyperkeratoses x 1 separate lesions were safely debrided with a sterile #312 blade to patient's level of comfort without incident. We discussed preventative and palliative care of these lesions including supportive and accommodative shoegear, padding, prefabricated and custom molded accommodative orthoses, use of a pumice stone and lotions/creams daily. - ABN on file  #Onychomycosis with pain  -Nails palliatively debrided as below. -Educated on self-care  Procedure: Nail Debridement Rationale: Pain Type of Debridement: manual, sharp  debridement. Instrumentation: Nail nipper, rotary burr. Number of Nails: 10  Return in about 3 months (around 05/17/2024) for Routine Foot Care.         Eve Hinders, DPM [facility] / [facility]

## 2024-03-23 DIAGNOSIS — F332 Major depressive disorder, recurrent severe without psychotic features: Secondary | ICD-10-CM | POA: Insufficient documentation

## 2024-03-23 DIAGNOSIS — R4189 Other symptoms and signs involving cognitive functions and awareness: Secondary | ICD-10-CM | POA: Insufficient documentation

## 2024-03-23 DIAGNOSIS — E559 Vitamin D deficiency, unspecified: Secondary | ICD-10-CM | POA: Insufficient documentation

## 2024-03-23 DIAGNOSIS — F331 Major depressive disorder, recurrent, moderate: Secondary | ICD-10-CM | POA: Insufficient documentation

## 2024-05-17 ENCOUNTER — Ambulatory Visit: Admitting: Podiatry

## 2024-05-20 ENCOUNTER — Ambulatory Visit: Admitting: Physician Assistant

## 2024-05-25 ENCOUNTER — Encounter: Payer: Self-pay | Admitting: Obstetrics and Gynecology

## 2024-05-25 ENCOUNTER — Ambulatory Visit: Admitting: Obstetrics and Gynecology

## 2024-05-25 VITALS — BP 138/72 | HR 55

## 2024-05-25 DIAGNOSIS — K5904 Chronic idiopathic constipation: Secondary | ICD-10-CM | POA: Diagnosis not present

## 2024-05-25 DIAGNOSIS — N3281 Overactive bladder: Secondary | ICD-10-CM | POA: Diagnosis not present

## 2024-05-25 DIAGNOSIS — R159 Full incontinence of feces: Secondary | ICD-10-CM | POA: Insufficient documentation

## 2024-05-25 DIAGNOSIS — N813 Complete uterovaginal prolapse: Secondary | ICD-10-CM | POA: Diagnosis not present

## 2024-05-25 DIAGNOSIS — R350 Frequency of micturition: Secondary | ICD-10-CM

## 2024-05-25 LAB — POCT URINALYSIS DIP (CLINITEK)
Bilirubin, UA: NEGATIVE
Blood, UA: NEGATIVE
Glucose, UA: NEGATIVE mg/dL
Nitrite, UA: NEGATIVE
POC PROTEIN,UA: NEGATIVE
Spec Grav, UA: 1.02 (ref 1.010–1.025)
Urobilinogen, UA: 0.2 U/dL
pH, UA: 6 (ref 5.0–8.0)

## 2024-05-25 NOTE — Assessment & Plan Note (Signed)
 Stage IV anterior, Stage IV posterior, Stage IV apical prolapse - For treatment of pelvic organ prolapse, we discussed options for management including expectant management, conservative management, and surgical management, such as Kegels, a pessary, pelvic floor physical therapy, and specific surgical procedures. - She is interested in surgery. Since she does not desire to be sexually active, we discussed the option for Colpocleisis.  - Will have her undergo urodynamic testing to evaluate for occult SUI.

## 2024-05-25 NOTE — Assessment & Plan Note (Signed)
-   We discussed the symptoms of overactive bladder (OAB), which include urinary urgency, urinary frequency, nocturia, with or without urge incontinence.  While we do not know the exact etiology of OAB, several treatment options exist. We discussed management including behavioral therapy (decreasing bladder irritants, urge suppression strategies, timed voids, bladder retraining), physical therapy, medication.  - Recommended increasing water intake and decreasing bladder irritants such as coffee, tea and soda.

## 2024-05-25 NOTE — Progress Notes (Signed)
 New Patient Evaluation and Consultation  Referring Provider: Silvano Angeline FALCON, NP PCP: Silvano Angeline FALCON, NP Date of Service: 05/25/2024  SUBJECTIVE Chief Complaint: bulge in vagina, no leakage, no frequency - urgency but ret  History of Present Illness: Jill Larson is a 83 y.o. White or Caucasian female seen in consultation at the request of NP Angeline Silvano for evaluation of incontinence.     Urinary Symptoms: Leaks urine with with movement to the bathroom and with urgency Leaks 2-3 time(s) per day.  Pad use: 2 pads or adult diapers per day.   Patient is bothered by UI symptoms. Has not been on medication for her bladder.   Day time voids 2-3.  Nocturia: 2-3 times per night to void. Voiding dysfunction:  empties bladder well.  Patient does not use a catheter to empty bladder.  When urinating, patient feels a weak stream, difficulty starting urine stream, and the need to urinate multiple times in a row Drinks: 20oz coke, 1 glass sweet tea, juice, milk, little water per day  UTIs: 0 UTI's in the last year.   Denies history of blood in urine and kidney or bladder stones   Pelvic Organ Prolapse Symptoms:                  Patient Admits to a feeling of a bulge the vaginal area. It has been present for 2 years.  Patient Denies seeing a bulge.  This bulge is bothersome. Wore a pessary for a few years but does not have one now. Got tired of wearing it. She was maintaining herself.   Bowel Symptom: Bowel movements: every 3-4 days  Stool consistency: soft  Straining: yes.  Splinting: no.  Incomplete evacuation: yes.  Patient Admits to accidental bowel leakage / fecal incontinence  Occurs: 3-4 time(s) per year, usually large amount  Consistency with leakage: soft  or liquid Bowel regimen: none, has taken dulcolax in the past  Sexual Function Sexually active: no.    Pelvic Pain Denies pelvic pain    Past Medical History:  Past Medical History:  Diagnosis Date    Accidental fall 08/31/2020   Acute encephalopathy 12/13/2019   Altered mental status 12/13/2019   Anxiety    ANXIETY 04/18/2008   Qualifier: Diagnosis of  By: Latisha CMA, Anette SANES PAIN, LUMBAR 04/18/2008   Qualifier: Diagnosis of  By: Latisha CMA, Leigh     Benign fundic gland polyps of stomach 2001   Bradycardia 08/31/2020   Chronic LBP 03/11/2015   Clinical depression 03/11/2015   CONSTIPATION 04/18/2008   Qualifier: Diagnosis of  By: Latisha CMA, Anette     DDD (degenerative disc disease)    Depression    Depression    Fibromyalgia    FIBROMYALGIA 04/18/2008   Qualifier: Diagnosis of  By: Latisha CMA, Leigh     GASTRIC POLYP 06/19/2008   Qualifier: History of  By: Christi MD, Glendia HERO    HYPERCHOLESTEROLEMIA 04/18/2008   Qualifier: Diagnosis of  By: Latisha CMA, Leigh     Hyperlipidemia    Hypertension    HYPERTENSION, BORDERLINE 04/18/2008   Qualifier: Diagnosis of  By: Latisha CMA, Leigh     Intestinal obstruction due to decreased peristalsis (HCC) 03/11/2015   Mitral valve prolapse    MITRAL VALVE PROLAPSE, HX OF 04/18/2008   Qualifier: Diagnosis of  By: Latisha CMA, Leigh     Osteoporosis    OSTEOPOROSIS 04/18/2008   Qualifier: Diagnosis of  By: Latisha CMA,  Leigh      Palpitations 06/14/2008   Qualifier: Diagnosis of  By: Christi MD, Glendia HERO    Pulmonary embolism (HCC) 03/11/2015   Scoliosis    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   SPL (spondylolisthesis) 03/11/2015     Past Surgical History:   Past Surgical History:  Procedure Laterality Date   BACK SURGERY     BREAST EXCISIONAL BIOPSY Right pt unsure   BREAST SURGERY     right to remove calcium deposits   TONSILLECTOMY AND ADENOIDECTOMY     TUBAL LIGATION       Past OB/GYN History: OB History  Gravida Para Term Preterm AB Living  2 2 2   2   SAB IAB Ectopic Multiple Live Births          # Outcome Date GA Lbr Len/2nd Weight Sex Type Anes PTL Lv  2 Term           1 Term             Obstetric Comments  ~7lbs for both     Vaginal deliveries: 2,  Forceps/ Vacuum deliveries: 0, Cesarean section: 0 Menopausal: Denies vaginal bleeding since menopause   Medications: Patient has a current medication list which includes the following prescription(s): atorvastatin, b complex vitamins, vitamin d , clonazepam, sertraline, and fish oil.   Allergies: Patient has no known allergies.   Social History:  Social History   Tobacco Use   Smoking status: Former    Types: Cigarettes   Smokeless tobacco: Never  Vaping Use   Vaping status: Never Used  Substance Use Topics   Alcohol use: Yes    Comment: Social - rarely   Drug use: No    Relationship status: widowed Patient lives with her daughter.   Patient is not employed. Regular exercise: No History of abuse: No  Family History:   Family History  Problem Relation Age of Onset   Hypertension Father    Pancreatic cancer Sister    Colon cancer Neg Hx      Review of Systems: Review of Systems  Constitutional:  Positive for malaise/fatigue. Negative for fever and weight loss.  Respiratory:  Negative for cough, shortness of breath and wheezing.   Cardiovascular:  Negative for chest pain, palpitations and leg swelling.  Gastrointestinal:  Negative for abdominal pain and blood in stool.  Genitourinary:  Negative for dysuria.  Musculoskeletal:  Positive for myalgias.  Skin:  Negative for rash.  Neurological:  Negative for dizziness and headaches.  Endo/Heme/Allergies:  Bruises/bleeds easily.  Psychiatric/Behavioral:  Positive for depression. The patient is nervous/anxious.      OBJECTIVE Physical Exam: Vitals:   05/25/24 1339 05/25/24 1413  BP: (!) 150/71 138/72  Pulse: (!) 55   There is no height or weight on file to calculate BMI.   Physical Exam Vitals reviewed. Exam conducted with a chaperone present.  Constitutional:      General: She is not in acute distress. Pulmonary:     Effort: Pulmonary effort is normal.  Abdominal:     General:  There is no distension.     Palpations: Abdomen is soft.     Tenderness: There is no abdominal tenderness. There is no rebound.  Musculoskeletal:        General: No swelling. Normal range of motion.  Skin:    General: Skin is warm and dry.     Findings: No rash.  Neurological:     Mental Status: She is alert and oriented to person, place, and  time.  Psychiatric:        Mood and Affect: Mood normal.        Behavior: Behavior normal.      GU / Detailed Urogynecologic Evaluation:  Pelvic Exam: Normal external female genitalia; Bartholin's and Skene's glands normal in appearance; urethral meatus normal in appearance, no urethral masses or discharge.   CST: negative  Speculum exam reveals normal vaginal mucosa with atrophy. Cervix normal appearance. Uterus normal single, nontender. Adnexa no mass, fullness, tenderness.     Pelvic floor strength I/V  Pelvic floor musculature: Right levator non-tender, Right obturator non-tender, Left levator non-tender, Left obturator non-tender  POP-Q:   POP-Q  3                                            Aa   4                                           Ba  4                                              C   5                                            Gh  3.5                                            Pb  6                                            tvl   3                                            Ap  4                                            Bp  -2                                              D      Rectal Exam:  Normal sphincter tone, moderate distal rectocele, enterocoele not present, no rectal masses, no sign of dyssynergia when asking the patient to bear down.  Post-Void Residual (PVR) by Bladder Scan: In order to evaluate bladder emptying, we discussed obtaining a postvoid residual and patient agreed to this procedure.  Procedure: The ultrasound unit was placed on the patient's abdomen  in the suprapubic region  after the patient had voided.    Post Void Residual - 05/25/24 1406       Post Void Residual   Post Void Residual 0 mL           Laboratory Results: Lab Results  Component Value Date   COLORU yellow 05/25/2024   CLARITYU clear 05/25/2024   GLUCOSEUR negative 05/25/2024   BILIRUBINUR negative 05/25/2024   SPECGRAV 1.020 05/25/2024   RBCUR negative 05/25/2024   PHUR 6.0 05/25/2024   PROTEINUR NEGATIVE 12/12/2019   UROBILINOGEN 0.2 05/25/2024   LEUKOCYTESUR Trace (A) 05/25/2024    Lab Results  Component Value Date   CREATININE 1.09 (H) 05/26/2023   CREATININE 0.74 12/17/2019   CREATININE 1.07 (H) 12/16/2019    No results found for: HGBA1C  Lab Results  Component Value Date   HGB 11.8 (L) 05/26/2023     ASSESSMENT AND PLAN Ms. Yorks is a 83 y.o. with:  1. Uterovaginal prolapse, complete   2. Overactive bladder   3. Urinary frequency   4. Chronic idiopathic constipation   5. Incontinence of feces, unspecified fecal incontinence type     Uterovaginal prolapse, complete Assessment & Plan: Stage IV anterior, Stage IV posterior, Stage IV apical prolapse - For treatment of pelvic organ prolapse, we discussed options for management including expectant management, conservative management, and surgical management, such as Kegels, a pessary, pelvic floor physical therapy, and specific surgical procedures. - She is interested in surgery. Since she does not desire to be sexually active, we discussed the option for Colpocleisis.  - Will have her undergo urodynamic testing to evaluate for occult SUI.    Overactive bladder Assessment & Plan: - We discussed the symptoms of overactive bladder (OAB), which include urinary urgency, urinary frequency, nocturia, with or without urge incontinence.  While we do not know the exact etiology of OAB, several treatment options exist. We discussed management including behavioral therapy (decreasing bladder irritants, urge suppression  strategies, timed voids, bladder retraining), physical therapy, medication.  - Recommended increasing water intake and decreasing bladder irritants such as coffee, tea and soda.     Urinary frequency -     POCT URINALYSIS DIP (CLINITEK)  Chronic idiopathic constipation Assessment & Plan: - For constipation, we reviewed the importance of a better bowel regimen.  We also discussed the importance of avoiding chronic straining, as it can exacerbate her pelvic floor symptoms; we discussed treating constipation and straining prior to surgery, as postoperative straining can lead to damage to the repair and recurrence of symptoms. We discussed initiating therapy with increasing fluid intake and daily miralax   Incontinence of feces, unspecified fecal incontinence type Assessment & Plan: - Not often, likely due to constipation symptoms and overflow. We discussued improvement of constipation can help prevent this.  - Can reassess after surgery as well.    Return for urodynamics   Jill LOISE Caper, MD

## 2024-05-25 NOTE — Assessment & Plan Note (Signed)
-   For constipation, we reviewed the importance of a better bowel regimen.  We also discussed the importance of avoiding chronic straining, as it can exacerbate her pelvic floor symptoms; we discussed treating constipation and straining prior to surgery, as postoperative straining can lead to damage to the repair and recurrence of symptoms. We discussed initiating therapy with increasing fluid intake and daily miralax

## 2024-05-25 NOTE — Assessment & Plan Note (Signed)
-   Not often, likely due to constipation symptoms and overflow. We discussued improvement of constipation can help prevent this.  - Can reassess after surgery as well.

## 2024-05-25 NOTE — Patient Instructions (Addendum)
 Today we talked about ways to manage bladder urgency such as altering your diet to avoid irritative beverages and foods (bladder diet) as well as attempting to decrease stress and other exacerbating factors.   The Most Bothersome Foods* The Least Bothersome Foods*  Coffee - Regular & Decaf Tea - caffeinated Carbonated beverages - cola, non-colas, diet & caffeine-free Alcohols - Beer, Red Wine, White Wine, [address] - Grapefruit, [place], Orange, [employer] - Cranberry, Grapefruit, Orange, Pineapple Vegetables - Tomato & Tomato Products Flavor Enhancers - Hot peppers, Spicy foods, Chili, Horseradish, Vinegar, Monosodium glutamate (MSG) Artificial Sweeteners - NutraSweet, Sweet 'N Low, Equal (sweetener), Saccharin Ethnic foods - [place], [place], [place] food [employer] - low-fat & whole Fruits - Bananas, Blueberries, Honeydew melon, Pears, Raisins, Watermelon Vegetables - Broccoli, [address] Sprouts, [place], Carrots, Cauliflower, [place], Cucumber, Mushrooms, Peas, Radishes, Squash, Zucchini, White potatoes, Sweet potatoes & yams Poultry - Chicken, Eggs, [place], [employer] - Beef, Dipl[employer] operational officer, Lamb Seafood - Shrimp, [place] fish, Salmon Grains - Oat, Rice Snacks - Pretzels, Popcorn  *Mitch ALF et al. Diet and its role in interstitial cystitis/bladder pain syndrome (IC/BPS) and comorbid conditions. [employer]. BJU Int. 2012 Jan 11.    Constipation: Our goal is to achieve formed bowel movements daily or every-other-day.  You may need to try different combinations of the following options to find what works best for you - everybody's body works differently so feel free to adjust the dosages as needed.  Some options to help maintain bowel health include:  Dietary changes (more leafy greens, vegetables and fruits; less processed foods) Fiber supplementation (Benefiber, FiberCon, Metamucil or Psyllium). Start slow and increase gradually to full dose. Over-the-counter agents such as: stool  softeners (Docusate or Colace) and/or laxatives (Miralax, milk of magnesia) . Take miralax once a day  Power Pudding is a natural mixture that may help your constipation.  To make blend 1 cup applesauce, 1 cup wheat bran, and 3/4 cup prune juice, refrigerate and then take 1 tablespoon daily with a large glass of water as needed.  URODYNAMICS (UDS) TEST INFORMATION  IMPORTANT: Please try to arrive with a comfortably full bladder!   What is UDS? Urodynamics is a bladder test used to evaluate how your bladder and urethra (tube you urinate out of) work to help find out the cause of your bladder symptoms and evaluate your bladder function in order to make the best treatment plan for you.   What to expect? A nurse will perform the test and will be with you during the entire exam. First we will have to empty your bladder on a special toilet.  After you have emptied your bladder, very small catheters (plastic tubing) will be placed into your bladder and into your vagina (or rectum). These special small catheters measure pressure to help measure your bladder function.  Your bladder will be gently filled with water and you will be asked to cough and strain at several different points during the test.   You will then be asked to empty your bladder in the special toilet with the catheters in place. Most patients can urinate (pee) easily with the catheters in place since the catheters are so small. In total this procedure lasts about 45 minutes to 1 hour.  After your test is completed, you will return (or possibly be seen the same day) to review the results, talk about treatment options and make a plan moving forward.

## 2024-07-01 ENCOUNTER — Ambulatory Visit: Admitting: Physician Assistant

## 2024-07-14 ENCOUNTER — Encounter: Admitting: Obstetrics and Gynecology

## 2024-08-02 ENCOUNTER — Ambulatory Visit: Admitting: Obstetrics and Gynecology

## 2024-08-06 ENCOUNTER — Ambulatory Visit: Admitting: Physician Assistant

## 2024-08-11 ENCOUNTER — Ambulatory Visit: Admitting: Podiatry

## 2024-08-11 ENCOUNTER — Ambulatory Visit: Admitting: Obstetrics and Gynecology

## 2024-08-11 ENCOUNTER — Encounter: Payer: Self-pay | Admitting: Podiatry

## 2024-08-11 DIAGNOSIS — M79675 Pain in left toe(s): Secondary | ICD-10-CM | POA: Diagnosis not present

## 2024-08-11 DIAGNOSIS — Q828 Other specified congenital malformations of skin: Secondary | ICD-10-CM | POA: Diagnosis not present

## 2024-08-11 DIAGNOSIS — M79674 Pain in right toe(s): Secondary | ICD-10-CM

## 2024-08-11 DIAGNOSIS — B351 Tinea unguium: Secondary | ICD-10-CM | POA: Diagnosis not present

## 2024-08-11 NOTE — Progress Notes (Unsigned)
 This patient presents to the office with chief complaint of long thick painful nails.  Patient says the nails are painful walking and wearing shoes.  This patient is unable to self treat.  This patient is unable to trim her nails since she is unable to reach her nails. She also relates having pain due to a forefoot callus. She presents to the office for preventative foot care services.  General Appearance  Alert, conversant and in no acute stress.  Vascular  Dorsalis pedis and posterior tibial  pulses are palpable  bilaterally.  Capillary return is within normal limits  bilaterally. Temperature is within normal limits  bilaterally.  Neurologic  Senn-Weinstein monofilament wire test within normal limits  bilaterally. Muscle power within normal limits bilaterally.  Nails Thick disfigured discolored nails with subungual debris  from hallux to fifth toes bilaterally. No evidence of bacterial infection or drainage bilaterally.  Orthopedic  No limitations of motion  feet .  No crepitus or effusions noted.  No bony pathology or digital deformities noted.  Skin  normotropic skin noted bilaterally.  No signs of infections or ulcers noted.   Porokeratosis sub 2 left.  Onychomycosis  Nails  B/L.  Pain in right toes  Pain in left toes  Debridement of nails both feet followed trimming the nails with dremel tool.  Debride porokeratosis left foot as a courtesy.  RTC prn   Cordella Bold DPM

## 2024-08-12 ENCOUNTER — Encounter: Payer: Self-pay | Admitting: Podiatry

## 2024-08-25 ENCOUNTER — Encounter: Admitting: Obstetrics and Gynecology

## 2024-09-10 ENCOUNTER — Ambulatory Visit: Admitting: Physician Assistant

## 2024-10-07 ENCOUNTER — Telehealth: Payer: Self-pay | Admitting: Podiatry

## 2024-10-07 NOTE — Telephone Encounter (Signed)
 Patient called and wanted to know the name of the cream that the provider recommends for her calluses.

## 2024-10-08 ENCOUNTER — Other Ambulatory Visit: Payer: Self-pay | Admitting: Podiatry

## 2024-10-08 MED ORDER — AMMONIUM LACTATE 12 % EX CREA: 1.0000 | TOPICAL_CREAM | CUTANEOUS | 0 refills | Status: AC | PRN

## 2024-10-11 ENCOUNTER — Ambulatory Visit: Admitting: Obstetrics and Gynecology

## 2024-11-11 ENCOUNTER — Ambulatory Visit: Admitting: Podiatry

## 2024-11-11 ENCOUNTER — Encounter: Payer: Self-pay | Admitting: Podiatry

## 2024-11-11 DIAGNOSIS — L909 Atrophic disorder of skin, unspecified: Secondary | ICD-10-CM

## 2024-11-11 DIAGNOSIS — M79674 Pain in right toe(s): Secondary | ICD-10-CM

## 2024-11-11 DIAGNOSIS — M79675 Pain in left toe(s): Secondary | ICD-10-CM

## 2024-11-11 DIAGNOSIS — Q828 Other specified congenital malformations of skin: Secondary | ICD-10-CM | POA: Diagnosis not present

## 2024-11-11 DIAGNOSIS — B351 Tinea unguium: Secondary | ICD-10-CM | POA: Diagnosis not present

## 2024-11-11 NOTE — Progress Notes (Unsigned)
 This patient presents to the office with chief complaint of long thick painful nails.  Patient says the nails are painful walking and wearing shoes.  This patient is unable to self treat.  This patient is unable to trim her nails since she is unable to reach her nails. She also relates having pain due to a forefoot callus. She presents to the office for preventative foot care services.  General Appearance  Alert, conversant and in no acute stress.  Vascular  Dorsalis pedis and posterior tibial  pulses are palpable  bilaterally.  Capillary return is within normal limits  bilaterally. Temperature is within normal limits  bilaterally.  Neurologic  Senn-Weinstein monofilament wire test within normal limits  bilaterally. Muscle power within normal limits bilaterally.  Nails Thick disfigured discolored nails with subungual debris  from hallux to fifth toes bilaterally. No evidence of bacterial infection or drainage bilaterally.  Orthopedic  No limitations of motion  feet .  No crepitus or effusions noted.  No bony pathology or digital deformities noted.  Skin  normotropic skin noted bilaterally.  No signs of infections or ulcers noted.   Porokeratosis sub 2 left.  Onychomycosis  Nails  B/L.  Pain in right toes  Pain in left toes  Debridement of nails both feet followed trimming the nails with dremel tool.  Debride porokeratosis left foot as a courtesy.  RTC 3 months    Cordella Bold DPM

## 2024-11-12 ENCOUNTER — Encounter: Payer: Self-pay | Admitting: Podiatry

## 2025-02-09 ENCOUNTER — Ambulatory Visit: Admitting: Podiatry
